# Patient Record
Sex: Female | Born: 1990 | Race: Black or African American | Hispanic: No | Marital: Single | Smoking: Former smoker
Health system: Southern US, Community
[De-identification: ages and names within clinical notes are randomized; demographics above are authoritative.]

## PROBLEM LIST (undated history)

## (undated) ENCOUNTER — Inpatient Hospital Stay: Payer: Self-pay

## (undated) DIAGNOSIS — O34219 Maternal care for unspecified type scar from previous cesarean delivery: Secondary | ICD-10-CM

## (undated) DIAGNOSIS — Z8041 Family history of malignant neoplasm of ovary: Secondary | ICD-10-CM

## (undated) DIAGNOSIS — Z803 Family history of malignant neoplasm of breast: Secondary | ICD-10-CM

## (undated) DIAGNOSIS — Z98891 History of uterine scar from previous surgery: Secondary | ICD-10-CM

## (undated) DIAGNOSIS — O1490 Unspecified pre-eclampsia, unspecified trimester: Secondary | ICD-10-CM

## (undated) HISTORY — DX: Family history of malignant neoplasm of breast: Z80.3

## (undated) HISTORY — DX: Maternal care for unspecified type scar from previous cesarean delivery: O34.219

## (undated) HISTORY — DX: Unspecified pre-eclampsia, unspecified trimester: O14.90

## (undated) HISTORY — DX: Family history of malignant neoplasm of ovary: Z80.41

## (undated) NOTE — *Deleted (*Deleted)
I value your feedback and entrusting us with your care. If you get a [facility] patient survey, I would appreciate you taking the time to let us know about your experience today. Thank you!  As of May 10, 2019, your lab results will be released to your MyChart immediately, before I even have a chance to see them. Please give me time to review them and contact you if there are any abnormalities. Thank you for your patience.  

---

## 2012-03-15 ENCOUNTER — Emergency Department: Payer: Self-pay | Admitting: Emergency Medicine

## 2012-05-19 ENCOUNTER — Emergency Department: Payer: Self-pay | Admitting: Emergency Medicine

## 2012-10-03 ENCOUNTER — Emergency Department: Payer: Self-pay | Admitting: Emergency Medicine

## 2015-01-27 ENCOUNTER — Encounter: Payer: Self-pay | Admitting: Emergency Medicine

## 2015-01-27 ENCOUNTER — Emergency Department
Admission: EM | Admit: 2015-01-27 | Discharge: 2015-01-27 | Disposition: A | Discharge status: Home / Self Care | Payer: BLUE CROSS/BLUE SHIELD | Attending: Emergency Medicine | Admitting: Emergency Medicine

## 2015-01-27 ENCOUNTER — Emergency Department: Payer: BLUE CROSS/BLUE SHIELD

## 2015-01-27 DIAGNOSIS — N939 Abnormal uterine and vaginal bleeding, unspecified: Secondary | ICD-10-CM

## 2015-01-27 DIAGNOSIS — Z3A00 Weeks of gestation of pregnancy not specified: Secondary | ICD-10-CM | POA: Insufficient documentation

## 2015-01-27 DIAGNOSIS — O209 Hemorrhage in early pregnancy, unspecified: Secondary | ICD-10-CM | POA: Diagnosis present

## 2015-01-27 DIAGNOSIS — Z87891 Personal history of nicotine dependence: Secondary | ICD-10-CM | POA: Insufficient documentation

## 2015-01-27 DIAGNOSIS — Z3490 Encounter for supervision of normal pregnancy, unspecified, unspecified trimester: Secondary | ICD-10-CM

## 2015-01-27 DIAGNOSIS — R109 Unspecified abdominal pain: Secondary | ICD-10-CM

## 2015-01-27 LAB — CHLAMYDIA/NGC RT PCR (ARMC ONLY)
Chlamydia Tr: NOT DETECTED
N gonorrhoeae: NOT DETECTED

## 2015-01-27 LAB — WET PREP, GENITAL
Clue Cells Wet Prep HPF POC: NONE SEEN
Trich, Wet Prep: NONE SEEN
WBC, Wet Prep HPF POC: NONE SEEN
Yeast Wet Prep HPF POC: NONE SEEN

## 2015-01-27 LAB — POCT PREGNANCY, URINE: Preg Test, Ur: POSITIVE — AB

## 2015-01-27 LAB — HCG, QUANTITATIVE, PREGNANCY: hCG, Beta Chain, Quant, S: 12291 m[IU]/mL — ABNORMAL HIGH (ref <5)

## 2015-01-27 NOTE — ED Notes (Signed)

## 2015-01-27 NOTE — ED Notes (Signed)
Pt states she had a positive pregnancy test last Tu, began bleeding this am, has been having menstrual like cramps off and on for a few days. Pt appears in no distress.

## 2015-01-27 NOTE — ED Provider Notes (Signed)
[facility] Emergency Department Provider Note   ____________________________________________  Time seen: 25  I have reviewed the triage vital signs and the nursing notes.   HISTORY  Chief Complaint Vaginal Bleeding   History limited by: Not Limited   HPI Cindy Lee is a 24 y.o. female who presents to the emergency department today because of concerns for lower abdominal cramping and vaginal spotting. Patient states that she recently found out she was pregnant. Last menstrual period was in July. She states that she has been having some cramping for the past couple of days. She states it was only today however that she noticed some bleeding. She states though some spotting. This is her first pregnancy. Denies any fevers. Denies any medical complaints.  History reviewed. No pertinent past medical history.  There are no active problems to display for this patient.   History reviewed. No pertinent past surgical history.  No current outpatient prescriptions on file.  Allergies Review of patient's allergies indicates no known allergies.  No family history on file.  Social History Social History  Substance Use Topics  . Smoking status: Former Games developer  . Smokeless tobacco: None  . Alcohol Use: No    Review of Systems  Constitutional: Negative for fever. Cardiovascular: Negative for chest pain. Respiratory: Negative for shortness of breath. Gastrointestinal: Positive for suprapubic and pelvic cramping. Genitourinary: Negative for dysuria. Musculoskeletal: Negative for back pain. Skin: Negative for rash. Neurological: Negative for headaches, focal weakness or numbness.  10-point ROS otherwise negative.  ____________________________________________   PHYSICAL EXAM:  VITAL SIGNS: ED Triage Vitals  Enc Vitals Group     BP 01/27/15 1541 154/83 mmHg     Pulse Rate 01/27/15 1541 115     Resp 01/27/15 1541 18     Temp 01/27/15 1541  98.6 F (37 C)     Temp Source 01/27/15 1541 Oral     SpO2 01/27/15 1541 97 %     Weight 01/27/15 1541 99 lb (44.906 kg)     Height 01/27/15 1541 5\' 4"  (1.626 m)     Head Cir --      Peak Flow --      Pain Score 01/27/15 1542 5     Pain Loc --      Pain Edu? --      Excl. in [place]? --      Constitutional: Alert and oriented. Well appearing and in no distress. Eyes: Conjunctivae are normal. PERRL. Normal extraocular movements. ENT   Head: Normocephalic and atraumatic.   Nose: No congestion/rhinnorhea.   Mouth/Throat: Mucous membranes are moist.   Neck: No stridor. Hematological/Lymphatic/Immunilogical: No cervical lymphadenopathy. Cardiovascular: Normal rate, regular rhythm.  No murmurs, rubs, or gallops. Respiratory: Normal respiratory effort without tachypnea nor retractions. Breath sounds are clear and equal bilaterally. No wheezes/rales/rhonchi. Gastrointestinal: Soft and nontender. No distention.  Genitourinary: No external lesions. No blood in vaginal vault. No abnormal discharge.  Musculoskeletal: Normal range of motion in all extremities. No joint effusions.  No lower extremity tenderness nor edema. Neurologic:  Normal speech and language. No gross focal neurologic deficits are appreciated. Speech is normal.  Skin:  Skin is warm, dry and intact. No rash noted. Psychiatric: Mood and affect are normal. Speech and behavior are normal. Patient exhibits appropriate insight and judgment.  ____________________________________________    LABS (pertinent positives/negatives)  Labs Reviewed  HCG, QUANTITATIVE, PREGNANCY - Abnormal; Notable for the following:    hCG, Beta Chain, Quant, S 12291 (*)  All other components within normal limits  POCT PREGNANCY, URINE - Abnormal; Notable for the following:    Preg Test, Ur POSITIVE (*)    All other components within normal limits  WET PREP, GENITAL  CHLAMYDIA/NGC RT PCR ([facility] ONLY)  POC URINE PREG, ED  ABO/RH      ____________________________________________   EKG  None  ____________________________________________    RADIOLOGY  Pelvic US IMPRESSION: Small embryonic pole without demonstrable cardiac activity. Findings are suspicious but not yet definitive for failed pregnancy. Recommend follow-up US in 10-14 days for definitive diagnosis. This recommendation follows SRU consensus guidelines: Diagnostic Criteria for Nonviable Pregnancy Early in the First Trimester. Malva Limes Med 2013; 369:1443-51. ____________________________________________   PROCEDURES  Procedure(s) performed: None  Critical Care performed: No  ____________________________________________   INITIAL IMPRESSION / ASSESSMENT AND PLAN / ED COURSE  Pertinent labs & imaging results that were available during my care of the patient were reviewed by me and considered in my medical decision making (see chart for details).  Patient presented to the emergency department with concerns for first trimester bleeding and cramping. Ultrasound here does raise some concern for spontaneous abortion. I did discuss the findings of the ultrasound with the patient and the necessity of repeat ultrasound. I did stress the importance of continuing prenatal vitamins. Furthermore patient was B+ and thus no Rhogam required today.  ____________________________________________   FINAL CLINICAL IMPRESSION(S) / ED DIAGNOSES  Final diagnoses:  Vaginal bleeding  Abdominal cramping  Pregnant     Phineas Semen, MD 01/27/15 2236

## 2015-01-27 NOTE — Discharge Instructions (Signed)
Please take prenatal vitamins everyday. Please seek medical attention for any high fevers, chest pain, shortness of breath, change in behavior, persistent vomiting, bloody stool or any other new or concerning symptoms.  Threatened Miscarriage A threatened miscarriage occurs when you have vaginal bleeding during your first 20 weeks of pregnancy but the pregnancy has not ended. If you have vaginal bleeding during this time, your health care provider will do tests to make sure you are still pregnant. If the tests show you are still pregnant and the developing baby (fetus) inside your womb (uterus) is still growing, your condition is considered a threatened miscarriage. A threatened miscarriage does not mean your pregnancy will end, but it does increase the risk of losing your pregnancy (complete miscarriage). CAUSES  The cause of a threatened miscarriage is usually not known. If you go on to have a complete miscarriage, the most common cause is an abnormal number of chromosomes in the developing baby. Chromosomes are the structures inside cells that hold all your genetic material. Some causes of vaginal bleeding that do not result in miscarriage include:  Having sex.  Having an infection.  Normal hormone changes of pregnancy.  Bleeding that occurs when an egg implants in your uterus. RISK FACTORS Risk factors for bleeding in early pregnancy include:  Obesity.  Smoking.  Drinking excessive amounts of alcohol or caffeine.  Recreational drug use. SIGNS AND SYMPTOMS  Light vaginal bleeding.  Mild abdominal pain or cramps. DIAGNOSIS  If you have bleeding with or without abdominal pain before 20 weeks of pregnancy, your health care provider will do tests to check whether you are still pregnant. One important test involves using sound waves and a computer (ultrasound) to create images of the inside of your uterus. Other tests include an internal exam of your vagina and uterus (pelvic exam) and  measurement of your baby's heart rate.  You may be diagnosed with a threatened miscarriage if:  Ultrasound testing shows you are still pregnant.  Your baby's heart rate is strong.  A pelvic exam shows that the opening between your uterus and your vagina (cervix) is closed.  Your heart rate and blood pressure are stable.  Blood tests confirm you are still pregnant. TREATMENT  No treatments have been shown to prevent a threatened miscarriage from going on to a complete miscarriage. However, the right home care is important.  HOME CARE INSTRUCTIONS   Make sure you keep all your appointments for prenatal care. This is very important.  Get plenty of rest.  Do not have sex or use tampons if you have vaginal bleeding.  Do not douche.  Do not smoke or use recreational drugs.  Do not drink alcohol.  Avoid caffeine. SEEK MEDICAL CARE IF:  You have light vaginal bleeding or spotting while pregnant.  You have abdominal pain or cramping.  You have a fever. SEEK IMMEDIATE MEDICAL CARE IF:  You have heavy vaginal bleeding.  You have blood clots coming from your vagina.  You have severe low back pain or abdominal cramps.  You have fever, chills, and severe abdominal pain. MAKE SURE YOU:  Understand these instructions.  Will watch your condition.  Will get help right away if you are not doing well or get worse. Document Released: 05/17/2005 Document Revised: 05/22/2013 Document Reviewed: 03/13/2013 [facility] Patient Information 2015 [place], [place]. This information is not intended to replace advice given to you by your health care provider. Make sure you discuss any questions you have with your health care provider.

## 2015-01-27 NOTE — ED Notes (Signed)
Pt states abd cramping and vaginal bleeding this AM, states she found out she was pregnant last week and has been nervous, pt has no other children, pt in no distress stating she is not bleeding at this time

## 2015-01-28 LAB — ABO/RH: ABO/RH(D): B POS

## 2015-05-12 ENCOUNTER — Encounter: Payer: Self-pay | Admitting: Urgent Care

## 2015-05-12 DIAGNOSIS — K0889 Other specified disorders of teeth and supporting structures: Secondary | ICD-10-CM | POA: Insufficient documentation

## 2015-05-12 DIAGNOSIS — Z3A21 21 weeks gestation of pregnancy: Secondary | ICD-10-CM | POA: Diagnosis not present

## 2015-05-12 DIAGNOSIS — O9989 Other specified diseases and conditions complicating pregnancy, childbirth and the puerperium: Secondary | ICD-10-CM | POA: Insufficient documentation

## 2015-05-12 DIAGNOSIS — O99612 Diseases of the digestive system complicating pregnancy, second trimester: Secondary | ICD-10-CM | POA: Diagnosis present

## 2015-05-12 DIAGNOSIS — R51 Headache: Secondary | ICD-10-CM | POA: Diagnosis not present

## 2015-05-12 DIAGNOSIS — Z87891 Personal history of nicotine dependence: Secondary | ICD-10-CM | POA: Diagnosis not present

## 2015-05-12 NOTE — ED Notes (Signed)
Patient presents with a 2 week history of RIGHT upper jaw pain secondary to a bad tooth. Patient advising that she had a filling that "came out". (+) swelling. Denies fever. Of note, patient is currently 5 months pregnant.

## 2015-05-13 ENCOUNTER — Emergency Department
Admission: EM | Admit: 2015-05-13 | Discharge: 2015-05-13 | Disposition: A | Discharge status: Home / Self Care | Payer: BLUE CROSS/BLUE SHIELD | Attending: Emergency Medicine | Admitting: Emergency Medicine

## 2015-05-13 DIAGNOSIS — K0889 Other specified disorders of teeth and supporting structures: Secondary | ICD-10-CM

## 2015-05-13 MED ORDER — LIDOCAINE-EPINEPHRINE 2 %-1:100000 IJ SOLN
1.7000 mL | Freq: Once | INTRAMUSCULAR | Status: AC
  Administered 2015-05-13: 04:00:00

## 2015-05-13 MED ORDER — BUPIVACAINE HCL (PF) 0.25 % IJ SOLN
INTRAMUSCULAR | Status: AC
  Filled 2015-05-13: qty 30

## 2015-05-13 MED ORDER — PENICILLIN V POTASSIUM 500 MG PO TABS: 500.0000 mg | ORAL_TABLET | Freq: Three times a day (TID) | ORAL | Status: DC

## 2015-05-13 MED ORDER — PENICILLIN V POTASSIUM 250 MG PO TABS
500.0000 mg | ORAL_TABLET | Freq: Once | ORAL | Status: AC
  Administered 2015-05-13: 500 mg via ORAL
  Filled 2015-05-13: qty 2

## 2015-05-13 MED ORDER — LIDOCAINE-EPINEPHRINE 2 %-1:100000 IJ SOLN
INTRAMUSCULAR | Status: AC
  Filled 2015-05-13: qty 1.7

## 2015-05-13 MED ORDER — ACETAMINOPHEN 500 MG PO TABS
1000.0000 mg | ORAL_TABLET | Freq: Once | ORAL | Status: AC
  Administered 2015-05-13: 1000 mg via ORAL
  Filled 2015-05-13: qty 2

## 2015-05-13 NOTE — ED Provider Notes (Signed)
[facility] Emergency Department Provider Note  ____________________________________________  Time seen: Approximately 225 AM  I have reviewed the triage vital signs and the nursing notes.   HISTORY  Chief Complaint Dental Pain    HPI Cindy Lee is a 24 y.o. female who comes into the hospital today with some dental pain. The patient reports that a week ago a filling came out of one of her upper teeth in the back. She reports that it started to get loose 2 weeks ago in Foley came out one week ago. The patient started having some pain in her tooth at 6 PM and reports this is gotten worse and worse. The patient is [redacted] weeks pregnant and only took Tylenol for pain because of her pregnancy. She reports that he was helping until later in the afternoon when it did not help at all. The patient does not have a dentist and did not try to contact a dentist. She reports that she is unable to eat and sleep. The patient has also had a migraine for the last 2 days. The patient rates her pain a 7 out of 10 in intensity. The patient has had no facial swelling and no fevers. The patient reports that she just wants something to be done to help her with the pain.   History reviewed. No pertinent past medical history.  There are no active problems to display for this patient.   History reviewed. No pertinent past surgical history.  No current outpatient prescriptions on file.  Allergies Review of patient's allergies indicates no known allergies.  No family history on file.  Social History Social History  Substance Use Topics  . Smoking status: Former Research scientist (life sciences)  . Smokeless tobacco: None  . Alcohol Use: No    Review of Systems Constitutional: No fever/chills Eyes: No visual changes. ENT: Dental pain Cardiovascular: Denies chest pain. Respiratory: Denies shortness of breath. Gastrointestinal: No abdominal pain.  No nausea, no vomiting.  No diarrhea.  No  constipation. Genitourinary: Negative for dysuria. Musculoskeletal: Negative for back pain. Skin: Negative for rash. Neurological: Headache  10-point ROS otherwise negative.  ____________________________________________   PHYSICAL EXAM:  VITAL SIGNS: ED Triage Vitals  Enc Vitals Group     BP 05/12/15 2306 132/85 mmHg     Pulse Rate 05/12/15 2306 87     Resp 05/12/15 2306 16     Temp 05/12/15 2306 97.8 F (36.6 C)     Temp Source 05/12/15 2306 Oral     SpO2 05/12/15 2306 98 %     Weight 05/12/15 2306 116 lb (52.617 kg)     Height 05/12/15 2306 _0  (1.651 m)     Head Cir --      Peak Flow --      Pain Score 05/12/15 2307 8     Pain Loc --      Pain Edu? --      Excl. in [place]? --     Constitutional: Alert and oriented. Well appearing and in moderate distress. Eyes: Conjunctivae are normal. PERRL. EOMI. Head: Atraumatic. Nose: No congestion/rhinnorhea. Mouth/Throat: Mucous membranes are moist.  Oropharynx non-erythematous. Cavity noted in right second molar with tenderness to palpation no swelling noticed Cardiovascular: Normal rate, regular rhythm. Grossly normal heart sounds.  Good peripheral circulation. Respiratory: Normal respiratory effort.  No retractions. Lungs CTAB. Gastrointestinal: Soft and nontender. No distention. Positive bowel sounds Musculoskeletal: No lower extremity tenderness nor edema.   Neurologic:  Normal speech and language.  Skin:  Skin is warm, dry and intact.  Psychiatric: Mood and affect are normal.   ____________________________________________   LABS (all labs ordered are listed, but only abnormal results are displayed)  Labs Reviewed - No data to display ____________________________________________  EKG  None ____________________________________________  RADIOLOGY  None ____________________________________________   PROCEDURES  Procedure(s) performed: Please, see procedure note(s).  The procedure performed was a dental  block of the right second molar. Dental lidocaine was used with the dental kit which was lidocaine, epinephrine injection. The block was done with a 30-gauge needle. Approximately 1-1/2-2 cc of lidocaine was instilled in the area above the second molar. The patient will be discharged to home to follow-up with the dentist.  Critical Care performed: No  ____________________________________________   INITIAL IMPRESSION / ASSESSMENT AND PLAN / ED COURSE  Pertinent labs & imaging results that were available during my care of the patient were reviewed by me and considered in my medical decision making (see chart for details).  This is a 24 year old female who comes in with dental pain. I gave the patient a dose of penicillin as well as Tylenol. I then did a dental block on the patient to help with her pain. The patient will be discharged home and she is strongly encouraged to follow-up with a dentist for further treatment of her dental pain and cavity. ____________________________________________   FINAL CLINICAL IMPRESSION(S) / ED DIAGNOSES  Final diagnoses:  Pain, dental      Loney Hering, MD 05/13/15 [phone]

## 2015-07-03 LAB — OB RESULTS CONSOLE RPR: RPR: NONREACTIVE

## 2015-07-03 LAB — OB RESULTS CONSOLE HIV ANTIBODY (ROUTINE TESTING): HIV: NONREACTIVE

## 2015-09-11 ENCOUNTER — Inpatient Hospital Stay
Admission: EM | Admit: 2015-09-11 | Discharge: 2015-09-15 | DRG: 766 | Disposition: A | Discharge status: Home / Self Care | Payer: BLUE CROSS/BLUE SHIELD | Attending: Obstetrics & Gynecology | Admitting: Obstetrics & Gynecology

## 2015-09-11 ENCOUNTER — Encounter: Payer: Self-pay | Admitting: *Deleted

## 2015-09-11 DIAGNOSIS — Z3A38 38 weeks gestation of pregnancy: Secondary | ICD-10-CM | POA: Diagnosis not present

## 2015-09-11 DIAGNOSIS — O99824 Streptococcus B carrier state complicating childbirth: Secondary | ICD-10-CM | POA: Diagnosis present

## 2015-09-11 DIAGNOSIS — Z87891 Personal history of nicotine dependence: Secondary | ICD-10-CM

## 2015-09-11 DIAGNOSIS — O4440 Low lying placenta NOS or without hemorrhage, unspecified trimester: Secondary | ICD-10-CM | POA: Diagnosis present

## 2015-09-11 DIAGNOSIS — O0933 Supervision of pregnancy with insufficient antenatal care, third trimester: Secondary | ICD-10-CM

## 2015-09-11 DIAGNOSIS — O9902 Anemia complicating childbirth: Secondary | ICD-10-CM | POA: Diagnosis present

## 2015-09-11 DIAGNOSIS — O1414 Severe pre-eclampsia complicating childbirth: Secondary | ICD-10-CM | POA: Diagnosis present

## 2015-09-11 DIAGNOSIS — D649 Anemia, unspecified: Secondary | ICD-10-CM | POA: Diagnosis present

## 2015-09-11 DIAGNOSIS — O1413 Severe pre-eclampsia, third trimester: Secondary | ICD-10-CM | POA: Diagnosis present

## 2015-09-11 LAB — COMPREHENSIVE METABOLIC PANEL
ALT: 19 U/L (ref 14–54)
AST: 26 U/L (ref 15–41)
Albumin: 3 g/dL — ABNORMAL LOW (ref 3.5–5.0)
Alkaline Phosphatase: 285 U/L — ABNORMAL HIGH (ref 38–126)
Anion gap: 9 (ref 5–15)
BUN: 8 mg/dL (ref 6–20)
CO2: 17 mmol/L — ABNORMAL LOW (ref 22–32)
Calcium: 9.2 mg/dL (ref 8.9–10.3)
Chloride: 107 mmol/L (ref 101–111)
Creatinine, Ser: 0.6 mg/dL (ref 0.44–1.00)
GFR calc Af Amer: >60 mL/min (ref >60)
GFR calc non Af Amer: >60 mL/min (ref >60)
Glucose, Bld: 100 mg/dL — ABNORMAL HIGH (ref 65–99)
Potassium: 3.3 mmol/L — ABNORMAL LOW (ref 3.5–5.1)
Sodium: 133 mmol/L — ABNORMAL LOW (ref 135–145)
Total Bilirubin: 0.6 mg/dL (ref 0.3–1.2)
Total Protein: 6.7 g/dL (ref 6.5–8.1)

## 2015-09-11 LAB — CBC
HCT: 31.5 % — ABNORMAL LOW (ref 35.0–47.0)
Hemoglobin: 10.5 g/dL — ABNORMAL LOW (ref 12.0–16.0)
MCH: 28.2 pg (ref 26.0–34.0)
MCHC: 33.2 g/dL (ref 32.0–36.0)
MCV: 84.7 fL (ref 80.0–100.0)
Platelets: 246 10*3/uL (ref 150–440)
RBC: 3.72 MIL/uL — ABNORMAL LOW (ref 3.80–5.20)
RDW: 15.2 % — ABNORMAL HIGH (ref 11.5–14.5)
WBC: 7.1 10*3/uL (ref 3.6–11.0)

## 2015-09-11 LAB — TYPE AND SCREEN
ABO/RH(D): B POS
Antibody Screen: NEGATIVE

## 2015-09-11 LAB — PROTEIN / CREATININE RATIO, URINE
Creatinine, Urine: 192 mg/dL
Protein Creatinine Ratio: 0.74 mg/mg{Cre} — ABNORMAL HIGH (ref 0.00–0.15)
Total Protein, Urine: 143 mg/dL

## 2015-09-11 MED ORDER — LABETALOL HCL 200 MG PO TABS
200.0000 mg | ORAL_TABLET | Freq: Two times a day (BID) | ORAL | Status: DC
  Administered 2015-09-11 – 2015-09-12 (×2): 200 mg via ORAL
  Filled 2015-09-11: qty 1

## 2015-09-11 MED ORDER — AMMONIA AROMATIC IN INHA
RESPIRATORY_TRACT | Status: DC
Start: 2015-09-11 — End: 2015-09-12
  Filled 2015-09-11: qty 10

## 2015-09-11 MED ORDER — MAGNESIUM SULFATE BOLUS VIA INFUSION
4.0000 g | Freq: Once | INTRAVENOUS | Status: AC
  Administered 2015-09-11: 4 g via INTRAVENOUS
  Filled 2015-09-11: qty 500

## 2015-09-11 MED ORDER — ACETAMINOPHEN 500 MG PO TABS
1000.0000 mg | ORAL_TABLET | ORAL | Status: DC | PRN
Start: 2015-09-11 — End: 2015-09-12

## 2015-09-11 MED ORDER — MISOPROSTOL 200 MCG PO TABS
ORAL_TABLET | ORAL | Status: AC
  Filled 2015-09-11: qty 4

## 2015-09-11 MED ORDER — LIDOCAINE HCL (PF) 1 % IJ SOLN
INTRAMUSCULAR | Status: AC
  Filled 2015-09-11: qty 30

## 2015-09-11 MED ORDER — LABETALOL HCL 5 MG/ML IV SOLN
INTRAVENOUS | Status: AC
  Administered 2015-09-11: 20 mg via INTRAVENOUS
  Filled 2015-09-11: qty 4

## 2015-09-11 MED ORDER — TERBUTALINE SULFATE 1 MG/ML IJ SOLN
0.2500 mg | Freq: Once | INTRAMUSCULAR | Status: AC | PRN
  Administered 2015-09-12: 0.25 mg via SUBCUTANEOUS

## 2015-09-11 MED ORDER — LIDOCAINE HCL (PF) 1 % IJ SOLN: 30.0000 mL | INTRAMUSCULAR | Status: DC | PRN

## 2015-09-11 MED ORDER — HYDRALAZINE HCL 20 MG/ML IJ SOLN: 10.0000 mg | Freq: Once | INTRAMUSCULAR | Status: DC | PRN

## 2015-09-11 MED ORDER — OXYTOCIN BOLUS FROM INFUSION: 500.0000 mL | INTRAVENOUS | Status: DC

## 2015-09-11 MED ORDER — LACTATED RINGERS IV SOLN
INTRAVENOUS | Status: DC
  Administered 2015-09-11 – 2015-09-12 (×3): via INTRAVENOUS
  Administered 2015-09-12: 100 mL/h via INTRAVENOUS

## 2015-09-11 MED ORDER — BUTORPHANOL TARTRATE 1 MG/ML IJ SOLN: 1.0000 mg | INTRAMUSCULAR | Status: DC | PRN

## 2015-09-11 MED ORDER — LABETALOL HCL 200 MG PO TABS
ORAL_TABLET | ORAL | Status: AC
  Administered 2015-09-11: 200 mg via ORAL
  Filled 2015-09-11: qty 1

## 2015-09-11 MED ORDER — LABETALOL HCL 5 MG/ML IV SOLN
20.0000 mg | INTRAVENOUS | Status: DC | PRN
  Administered 2015-09-11 (×2): 20 mg via INTRAVENOUS

## 2015-09-11 MED ORDER — LACTATED RINGERS IV SOLN
500.0000 mL | INTRAVENOUS | Status: DC | PRN
  Administered 2015-09-12: 200 mL via INTRAVENOUS

## 2015-09-11 MED ORDER — MAGNESIUM SULFATE 50 % IJ SOLN
2.0000 g/h | INTRAVENOUS | Status: DC
  Administered 2015-09-12 – 2015-09-13 (×2): 2 g/h via INTRAVENOUS
  Filled 2015-09-11 (×3): qty 80

## 2015-09-11 MED ORDER — CALCIUM GLUCONATE 10 % IV SOLN
INTRAVENOUS | Status: AC
  Filled 2015-09-11: qty 10

## 2015-09-11 MED ORDER — DINOPROSTONE 10 MG VA INST
10.0000 mg | VAGINAL_INSERT | Freq: Once | VAGINAL | Status: AC
  Administered 2015-09-11: 10 mg via VAGINAL
  Filled 2015-09-11: qty 1

## 2015-09-11 MED ORDER — SODIUM CHLORIDE FLUSH 0.9 % IV SOLN
INTRAVENOUS | Status: AC
  Administered 2015-09-11: 10 mL
  Filled 2015-09-11: qty 10

## 2015-09-11 MED ORDER — ONDANSETRON HCL 4 MG/2ML IJ SOLN
4.0000 mg | Freq: Four times a day (QID) | INTRAMUSCULAR | Status: DC | PRN
  Administered 2015-09-12: 8 mg via INTRAVENOUS

## 2015-09-11 MED ORDER — OXYTOCIN 40 UNITS IN LACTATED RINGERS INFUSION - SIMPLE MED: 1.0000 m[IU]/min | INTRAVENOUS | Status: DC

## 2015-09-11 MED ORDER — OXYTOCIN 40 UNITS IN LACTATED RINGERS INFUSION - SIMPLE MED
2.5000 [IU]/h | INTRAVENOUS | Status: DC
  Filled 2015-09-11: qty 1000

## 2015-09-11 MED ORDER — CITRIC ACID-SODIUM CITRATE 334-500 MG/5ML PO SOLN
30.0000 mL | ORAL | Status: DC | PRN
  Administered 2015-09-12: 30 mL via ORAL

## 2015-09-11 MED ORDER — HYDRALAZINE HCL 20 MG/ML IJ SOLN
INTRAMUSCULAR | Status: AC
  Filled 2015-09-11: qty 1

## 2015-09-11 MED ORDER — OXYTOCIN 10 UNIT/ML IJ SOLN
INTRAMUSCULAR | Status: DC
Start: 2015-09-11 — End: 2015-09-12
  Filled 2015-09-11: qty 2

## 2015-09-11 NOTE — H&P (Signed)
OB History & Physical   History of Present Illness:  Chief Complaint:   HPI:  Cindy Lee is a 25 y.o. G1P0 female at 38.0 by 16week ultrasound with EDC of 09/25/15 was sent from the office today with BP of 144/86 and 3day frontal headache.     +FM, no CTX, no LOF, no VB, + HA, no change of vision, no RUQ/epigastric pain, no SOB  Pregnancy Issues: 1. Elevated blood pressure 2. Late to care 3. Anemia 4. Resolved low-lying placenta  Maternal Medical History:  History reviewed. No pertinent past medical history.  History reviewed. No pertinent past surgical history.  No Known Allergies  Prior to Admission medications   Medication Sig Start Date End Date Taking? Authorizing Provider  penicillin v potassium (VEETID) 500 MG tablet Take 1 tablet (500 mg total) by mouth 3 (three) times daily. 05/13/15   Rebecka ApleyAllison P Webster, MD     [facility]   Social History: She  reports that she has quit smoking. She does not have any smokeless tobacco history on file. She reports that she does not drink alcohol.  Family History: family history is not on file.   Review of Systems: Negative x 10 systems reviewed except as noted in the HPI.     Physical Exam:  Vital Signs: BP 153/97 mmHg  Pulse 96  Temp(Src) 98.6 F (37 C) (Oral)  Resp 16  LMP 12/19/2014 General: no acute distress.  HEENT: normocephalic, atraumatic Heart: regular rate & rhythm.  No murmurs/rubs/gallops Lungs: clear to auscultation bilaterally, normal respiratory effort Abdomen: soft, gravid, non-tender;  EFW: 6.15 Pelvic:   External: Normal external female genitalia  Cervix: Dilation: Closed / Effacement (%): Thick / Station: -3    Extremities: non-tender, symmetric, 0 edema bilaterally.  DTRs: 2+ Neurologic: Alert & oriented x 3.    Results for orders placed or performed during the hospital encounter of 09/11/15 (from the past 24 hour(s))  Protein / creatinine ratio, urine     Status:  Abnormal   Collection Time: 09/11/15  4:18 PM  Result Value Ref Range   Creatinine, Urine 192 mg/dL   Total Protein, Urine 143 mg/dL   Protein Creatinine Ratio 0.74 (H) 0.00 - 0.15 mg/mg[Cre]  CBC     Status: Abnormal   Collection Time: 09/11/15  4:22 PM  Result Value Ref Range   WBC 7.1 3.6 - 11.0 K/uL   RBC 3.72 (L) 3.80 - 5.20 MIL/uL   Hemoglobin 10.5 (L) 12.0 - 16.0 g/dL   HCT 16.131.5 (L) 09.635.0 - 04.547.0 %   MCV 84.7 80.0 - 100.0 fL   MCH 28.2 26.0 - 34.0 pg   MCHC 33.2 32.0 - 36.0 g/dL   RDW 40.915.2 (H) 81.111.5 - 91.414.5 %   Platelets 246 150 - 440 K/uL  Type and screen [facility]     Status: None   Collection Time: 09/11/15  4:22 PM  Result Value Ref Range   ABO/RH(D) B POS    Antibody Screen NEG    Sample Expiration 09/14/2015   Comprehensive metabolic panel     Status: Abnormal   Collection Time: 09/11/15  4:22 PM  Result Value Ref Range   Sodium 133 (L) 135 - 145 mmol/L   Potassium 3.3 (L) 3.5 - 5.1 mmol/L   Chloride 107 101 - 111 mmol/L   CO2 17 (L) 22 - 32 mmol/L   Glucose, Bld 100 (H) 65 - 99 mg/dL   BUN 8 6 -  20 mg/dL   Creatinine, Ser 4.09 0.44 - 1.00 mg/dL   Calcium 9.2 8.9 - 81.1 mg/dL   Total Protein 6.7 6.5 - 8.1 g/dL   Albumin 3.0 (L) 3.5 - 5.0 g/dL   AST 26 15 - 41 U/L   ALT 19 14 - 54 U/L   Alkaline Phosphatase 285 (H) 38 - 126 U/L   Total Bilirubin 0.6 0.3 - 1.2 mg/dL   GFR calc non Af Amer >60 >60 mL/min   GFR calc Af Amer >60 >60 mL/min   Anion gap 9 5 - 15    Pertinent Results:  Prenatal Labs: Blood type/Rh B pos  Antibody screen neg  Rubella Immune  Varicella Immune  RPR NR  HBsAg Neg  HIV NR  GC neg  Chlamydia neg  Genetic screening negative  1 hour GTT 134  3 hour GTT   GBS Positive 08/29/15   FHT:  135 mod + accels no decels TOCO: q64min SVE: closed/thick/high/posterior/firm - very difficult exam; patient does not tolerate well.   Cephalic by leopolds  Assessment:  Cindy Lee is a 25 y.o. G1P0 female at 38.0  with preeclampsia with severe features.   Plan:  1. Admit to Labor & Delivery 2. CBC, T&S, Clrs, IVF 3. GBS  Positive - penicillin ordered, to start in active labor 4. Consents obtained. 5. Continuous efm/toco 6. Preeclampsia:  Severe features with HA, some occasional 160 SBP but resolving with recheck.  Start Magnesium Sulfate bolus and gtt @ 2g/hr.  Foley, continuous pulse ox, monitor for mag toxicity 7. Active management: cytotec q4h as tolerated, will consider foley bulb and/or pitocin when cervix is more accessible.    ----- Ranae Plumber, MD Attending Obstetrician and Gynecologist [facility]

## 2015-09-11 NOTE — Progress Notes (Signed)
RN at the bedside assisting patient to comfortable position, EFM adjusted.  Pt. Just completed a regular diet without emesis (regular diet per patient request - OK'd via MD).  Clear liquids after midnight. Pt. In agreement. IV site without s/s of infiltrate, infusing 100 ml LR and 25 ml of Mag. Sulfate (2g/hr).  UOP 75 hour.  Pt. Currently without concern for epigastric pain, or blurred vision. Pt. Complained of slight headache earlier, but declined tylenol.  Reflex +2, no clonus, +1 pedal edema bilateral. FHR with baseline 120's, mod. variability, with accels and no decels; contracting irreg. 2-7 minutes, 40-80 secs. Per patient, "not feeling contractions". RN will continue to monitor.

## 2015-09-12 ENCOUNTER — Inpatient Hospital Stay: Payer: BLUE CROSS/BLUE SHIELD | Admitting: Anesthesiology

## 2015-09-12 ENCOUNTER — Encounter
Admission: EM | Disposition: A | Discharge status: Home / Self Care | Payer: Self-pay | Source: Home / Self Care | Attending: Obstetrics & Gynecology

## 2015-09-12 ENCOUNTER — Encounter: Payer: Self-pay | Admitting: *Deleted

## 2015-09-12 LAB — RPR: RPR Ser Ql: NONREACTIVE

## 2015-09-12 LAB — PLATELET COUNT: Platelets: 249 10*3/uL (ref 150–440)

## 2015-09-12 SURGERY — Surgical Case
Anesthesia: Spinal

## 2015-09-12 MED ORDER — PRENATAL MULTIVITAMIN CH
1.0000 | ORAL_TABLET | Freq: Every day | ORAL | Status: DC
  Administered 2015-09-13 – 2015-09-15 (×3): 1 via ORAL
  Filled 2015-09-12 (×4): qty 1

## 2015-09-12 MED ORDER — OXYTOCIN 40 UNITS IN LACTATED RINGERS INFUSION - SIMPLE MED: 1.0000 m[IU]/min | INTRAVENOUS | Status: DC

## 2015-09-12 MED ORDER — CEFAZOLIN SODIUM-DEXTROSE 2-4 GM/100ML-% IV SOLN
INTRAVENOUS | Status: AC
  Administered 2015-09-12: 2 g via INTRAVENOUS
  Filled 2015-09-12: qty 100

## 2015-09-12 MED ORDER — CEFAZOLIN SODIUM-DEXTROSE 2-3 GM-% IV SOLR
INTRAVENOUS | Status: DC | PRN
  Administered 2015-09-12: 2 g via INTRAVENOUS

## 2015-09-12 MED ORDER — SIMETHICONE 80 MG PO CHEW
80.0000 mg | CHEWABLE_TABLET | Freq: Three times a day (TID) | ORAL | Status: DC
  Administered 2015-09-13 – 2015-09-15 (×7): 80 mg via ORAL
  Filled 2015-09-12 (×6): qty 1

## 2015-09-12 MED ORDER — PENICILLIN G POTASSIUM 5000000 UNITS IJ SOLR
5.0000 10*6.[IU] | Freq: Once | INTRAMUSCULAR | Status: DC
  Filled 2015-09-12: qty 5

## 2015-09-12 MED ORDER — NALOXONE HCL 2 MG/2ML IJ SOSY
1.0000 ug/kg/h | PREFILLED_SYRINGE | INTRAVENOUS | Status: DC | PRN
  Filled 2015-09-12: qty 2

## 2015-09-12 MED ORDER — BUPIVACAINE ON-Q PAIN PUMP (FOR ORDER SET NO CHG): INJECTION | Status: DC

## 2015-09-12 MED ORDER — DIBUCAINE 1 % RE OINT: 1.0000 "application " | TOPICAL_OINTMENT | RECTAL | Status: DC | PRN

## 2015-09-12 MED ORDER — TERBUTALINE SULFATE 1 MG/ML IJ SOLN
0.2500 mg | INTRAMUSCULAR | Status: AC
  Administered 2015-09-12: 0.25 mg via SUBCUTANEOUS

## 2015-09-12 MED ORDER — NALBUPHINE HCL 10 MG/ML IJ SOLN: 5.0000 mg | Freq: Once | INTRAMUSCULAR | Status: DC | PRN

## 2015-09-12 MED ORDER — OXYTOCIN 40 UNITS IN LACTATED RINGERS INFUSION - SIMPLE MED
INTRAVENOUS | Status: AC
  Administered 2015-09-12: 600 mL via INTRAVENOUS
  Filled 2015-09-12: qty 1000

## 2015-09-12 MED ORDER — OXYTOCIN 40 UNITS IN LACTATED RINGERS INFUSION - SIMPLE MED: 2.5000 [IU]/h | INTRAVENOUS | Status: AC

## 2015-09-12 MED ORDER — OXYCODONE-ACETAMINOPHEN 5-325 MG PO TABS
1.0000 | ORAL_TABLET | ORAL | Status: DC | PRN
  Administered 2015-09-13 – 2015-09-15 (×7): 1 via ORAL
  Filled 2015-09-12 (×7): qty 1

## 2015-09-12 MED ORDER — CITRIC ACID-SODIUM CITRATE 334-500 MG/5ML PO SOLN
ORAL | Status: AC
  Filled 2015-09-12: qty 15

## 2015-09-12 MED ORDER — MISOPROSTOL 25 MCG QUARTER TABLET
25.0000 ug | ORAL_TABLET | ORAL | Status: DC | PRN
  Administered 2015-09-12: 25 ug via VAGINAL

## 2015-09-12 MED ORDER — BUPIVACAINE IN DEXTROSE 0.75-8.25 % IT SOLN
INTRATHECAL | Status: DC | PRN
  Administered 2015-09-12: 1.6 mL via INTRATHECAL

## 2015-09-12 MED ORDER — KETOROLAC TROMETHAMINE 30 MG/ML IJ SOLN: 30.0000 mg | Freq: Four times a day (QID) | INTRAMUSCULAR | Status: AC | PRN

## 2015-09-12 MED ORDER — WITCH HAZEL-GLYCERIN EX PADS: 1.0000 "application " | MEDICATED_PAD | CUTANEOUS | Status: DC | PRN

## 2015-09-12 MED ORDER — OXYCODONE-ACETAMINOPHEN 5-325 MG PO TABS
2.0000 | ORAL_TABLET | ORAL | Status: DC | PRN
  Filled 2015-09-12: qty 2

## 2015-09-12 MED ORDER — MISOPROSTOL 25 MCG QUARTER TABLET
ORAL_TABLET | ORAL | Status: AC
  Administered 2015-09-12: 25 ug via VAGINAL
  Filled 2015-09-12: qty 0.25

## 2015-09-12 MED ORDER — TERBUTALINE SULFATE 1 MG/ML IJ SOLN
INTRAMUSCULAR | Status: AC
  Administered 2015-09-12: 0.25 mg via SUBCUTANEOUS
  Filled 2015-09-12: qty 1

## 2015-09-12 MED ORDER — MORPHINE SULFATE (PF) 0.5 MG/ML IJ SOLN
INTRAMUSCULAR | Status: DC | PRN
  Administered 2015-09-12: .1 mg via INTRATHECAL

## 2015-09-12 MED ORDER — KETOROLAC TROMETHAMINE 30 MG/ML IJ SOLN
30.0000 mg | Freq: Four times a day (QID) | INTRAMUSCULAR | Status: AC | PRN
  Administered 2015-09-12 – 2015-09-13 (×4): 30 mg via INTRAVENOUS
  Filled 2015-09-12 (×4): qty 1

## 2015-09-12 MED ORDER — TERBUTALINE SULFATE 1 MG/ML IJ SOLN: 0.2500 mg | Freq: Once | INTRAMUSCULAR | Status: DC | PRN

## 2015-09-12 MED ORDER — BUPIVACAINE HCL 0.5 % IJ SOLN
INTRAMUSCULAR | Status: DC | PRN
  Administered 2015-09-12: 13 mL

## 2015-09-12 MED ORDER — DIPHENHYDRAMINE HCL 25 MG PO CAPS: 25.0000 mg | ORAL_CAPSULE | Freq: Four times a day (QID) | ORAL | Status: DC | PRN

## 2015-09-12 MED ORDER — NALOXONE HCL 0.4 MG/ML IJ SOLN: 0.4000 mg | INTRAMUSCULAR | Status: DC | PRN

## 2015-09-12 MED ORDER — BUPIVACAINE HCL (PF) 0.5 % IJ SOLN
INTRAMUSCULAR | Status: AC
Start: 2015-09-12 — End: 2015-09-13
  Filled 2015-09-12: qty 30

## 2015-09-12 MED ORDER — SODIUM CHLORIDE FLUSH 0.9 % IV SOLN
INTRAVENOUS | Status: AC
  Filled 2015-09-12: qty 20

## 2015-09-12 MED ORDER — ONDANSETRON HCL 4 MG/2ML IJ SOLN: 4.0000 mg | Freq: Three times a day (TID) | INTRAMUSCULAR | Status: DC | PRN

## 2015-09-12 MED ORDER — PENICILLIN G POTASSIUM 5000000 UNITS IJ SOLR
2.5000 10*6.[IU] | INTRAVENOUS | Status: DC
  Filled 2015-09-12 (×4): qty 2.5

## 2015-09-12 MED ORDER — FENTANYL CITRATE (PF) 100 MCG/2ML IJ SOLN
INTRAMUSCULAR | Status: DC | PRN
  Administered 2015-09-12: 20 ug via INTRATHECAL

## 2015-09-12 MED ORDER — SENNOSIDES-DOCUSATE SODIUM 8.6-50 MG PO TABS
2.0000 | ORAL_TABLET | ORAL | Status: DC
  Administered 2015-09-14 – 2015-09-15 (×2): 2 via ORAL
  Filled 2015-09-12 (×2): qty 2

## 2015-09-12 MED ORDER — DIPHENHYDRAMINE HCL 25 MG PO CAPS: 25.0000 mg | ORAL_CAPSULE | ORAL | Status: DC | PRN

## 2015-09-12 MED ORDER — BUPIVACAINE 0.25 % ON-Q PUMP DUAL CATH 400 ML
INJECTION | Status: AC
  Filled 2015-09-12: qty 400

## 2015-09-12 MED ORDER — CITRIC ACID-SODIUM CITRATE 334-500 MG/5ML PO SOLN
ORAL | Status: AC
  Administered 2015-09-12: 30 mL via ORAL
  Filled 2015-09-12: qty 15

## 2015-09-12 MED ORDER — MENTHOL 3 MG MT LOZG
1.0000 | LOZENGE | OROMUCOSAL | Status: DC | PRN
  Filled 2015-09-12: qty 9

## 2015-09-12 MED ORDER — NALBUPHINE HCL 10 MG/ML IJ SOLN: 5.0000 mg | INTRAMUSCULAR | Status: DC | PRN

## 2015-09-12 MED ORDER — FAMOTIDINE 20 MG PO TABS
20.0000 mg | ORAL_TABLET | Freq: Two times a day (BID) | ORAL | Status: DC
  Administered 2015-09-12: 20 mg via ORAL
  Filled 2015-09-12: qty 1

## 2015-09-12 MED ORDER — MEPERIDINE HCL 25 MG/ML IJ SOLN: 6.2500 mg | INTRAMUSCULAR | Status: DC | PRN

## 2015-09-12 MED ORDER — SODIUM CHLORIDE 0.9% FLUSH
3.0000 mL | INTRAVENOUS | Status: DC | PRN
  Administered 2015-09-13: 10 mL via INTRAVENOUS
  Filled 2015-09-12: qty 3

## 2015-09-12 MED ORDER — CEFAZOLIN SODIUM-DEXTROSE 2-4 GM/100ML-% IV SOLN
2.0000 g | INTRAVENOUS | Status: AC
  Administered 2015-09-12: 2 g via INTRAVENOUS

## 2015-09-12 MED ORDER — SODIUM CHLORIDE 0.9 % IJ SOLN
INTRAMUSCULAR | Status: AC
  Administered 2015-09-12: 100 mL
  Filled 2015-09-12: qty 20

## 2015-09-12 MED ORDER — FERROUS SULFATE 325 (65 FE) MG PO TABS
325.0000 mg | ORAL_TABLET | Freq: Two times a day (BID) | ORAL | Status: DC
  Administered 2015-09-13 – 2015-09-15 (×4): 325 mg via ORAL
  Filled 2015-09-12 (×4): qty 1

## 2015-09-12 MED ORDER — COCONUT OIL OIL
1.0000 "application " | TOPICAL_OIL | Status: DC | PRN
  Filled 2015-09-12: qty 120

## 2015-09-12 MED ORDER — LACTATED RINGERS IV SOLN
INTRAVENOUS | Status: DC
  Administered 2015-09-13: 08:00:00 via INTRAVENOUS

## 2015-09-12 MED ORDER — TERBUTALINE SULFATE 1 MG/ML IJ SOLN
INTRAMUSCULAR | Status: AC
  Filled 2015-09-12: qty 1

## 2015-09-12 MED ORDER — DIPHENHYDRAMINE HCL 50 MG/ML IJ SOLN: 12.5000 mg | INTRAMUSCULAR | Status: DC | PRN

## 2015-09-12 MED ORDER — PRENATAL MULTIVITAMIN CH: 1.0000 | ORAL_TABLET | Freq: Every day | ORAL | Status: DC

## 2015-09-12 MED ORDER — PHENYLEPHRINE HCL 10 MG/ML IJ SOLN
INTRAMUSCULAR | Status: DC | PRN
  Administered 2015-09-12 (×6): 100 ug via INTRAVENOUS

## 2015-09-12 SURGICAL SUPPLY — 28 items
3-0 VICRYL ×2 IMPLANT
CANISTER SUCT 3000ML (MISCELLANEOUS) ×2 IMPLANT
CATH KIT ON-Q SILVERSOAK 5IN (CATHETERS) ×4 IMPLANT
DRSG TELFA 3X8 NADH (GAUZE/BANDAGES/DRESSINGS) ×2 IMPLANT
ELECT CAUTERY BLADE 6.4 (BLADE) ×2 IMPLANT
ELECT REM PT RETURN 9FT ADLT (ELECTROSURGICAL) ×2
ELECTRODE REM PT RTRN 9FT ADLT (ELECTROSURGICAL) ×1 IMPLANT
GAUZE SPONGE 4X4 12PLY STRL (GAUZE/BANDAGES/DRESSINGS) ×2 IMPLANT
GLOVE BIO SURGEON STRL SZ7 (GLOVE) ×2 IMPLANT
GLOVE INDICATOR 7.5 STRL GRN (GLOVE) ×2 IMPLANT
GOWN STRL REUS W/ TWL LRG LVL3 (GOWN DISPOSABLE) ×3 IMPLANT
GOWN STRL REUS W/TWL LRG LVL3 (GOWN DISPOSABLE) ×3
LIQUID BAND (GAUZE/BANDAGES/DRESSINGS) ×2 IMPLANT
NS IRRIG 1000ML POUR BTL (IV SOLUTION) ×2 IMPLANT
PACK C SECTION AR (MISCELLANEOUS) ×2 IMPLANT
PAD OB MATERNITY 4.3X12.25 (PERSONAL CARE ITEMS) ×4 IMPLANT
PAD PREP 24X41 OB/GYN DISP (PERSONAL CARE ITEMS) ×2 IMPLANT
SPONGE LAP 18X18 5 PK (GAUZE/BANDAGES/DRESSINGS) ×4 IMPLANT
STRIP CLOSURE SKIN 1/2X4 (GAUZE/BANDAGES/DRESSINGS) ×4 IMPLANT
SUT CHROMIC GUT BROWN 0 54 (SUTURE) IMPLANT
SUT CHROMIC GUT BROWN 0 54IN (SUTURE)
SUT MNCRL 4-0 (SUTURE) ×1
SUT MNCRL 4-0 27XMFL (SUTURE) ×1
SUT PDS AB 1 TP1 96 (SUTURE) ×2 IMPLANT
SUT PLAIN 2 0 XLH (SUTURE) IMPLANT
SUT VIC AB 0 CT1 36 (SUTURE) ×8 IMPLANT
SUTURE MNCRL 4-0 27XMF (SUTURE) ×1 IMPLANT
SWABSTK COMLB BENZOIN TINCTURE (MISCELLANEOUS) ×4 IMPLANT

## 2015-09-12 NOTE — Op Note (Signed)
Cesarean Section Procedure Note   [place] NADALYN DERINGER   09/12/2015   Pre-operative Diagnosis:  1) intrauterine pregnancy at [redacted]w[redacted]d  2) severe preeclampsia 3) non reasuring fetal heart rate.   Post-operative Diagnosis:  1) intrauterine pregnancy at [redacted]w[redacted]d  2) severe preeclampsia 3) non reasuring fetal heart rate.   Procedure: primary low transverse cesarean section via Pfannenstiel incision with double layer uterine closure.   Surgeon: Moishe Spice) and Role:    * Conard Novak, MD - Primary   Anesthesia: spinal   Findings:  1) normal appearing gravid uterus, fallopian tubes, and ovaries 2) viable female infant   Estimated Blood Loss: 750 ml  Total IV Fluids: 1,100 ml   Urine Output: 200 mL  Specimens: None  Complications: no complications  Disposition: PACU - hemodynamically stable.   Maternal Condition: stable   Baby condition / location:  Couplet care / Skin to Skin  Procedure Details:  The patient was seen in the Holding Room. The risks, benefits, complications, treatment options, and expected outcomes were discussed with the patient. The patient concurred with the proposed plan, giving informed consent. identified as Meghen Jerrye Beavers and the procedure verified as C-Section Delivery. A Time Out was held and the above information confirmed.   After induction of anesthesia, the patient was draped and prepped in the usual sterile manner. A Pfannenstiel incision was made and carried down through the subcutaneous tissue to the fascia. Fascial incision was made and extended transversely. The fascia was separated from the underlying rectus tissue superiorly and inferiorly. The peritoneum was identified and entered. Peritoneal incision was extended longitudinally. The bladder flap was bluntly freed from the lower uterine segment. A low transverse uterine incision was made and the hysterotomy was extended with cranial-caudal tension. Delivered from cephalic presentation was a  2,420  gram Living newborn infant(s) or Female with Apgar scores of 8 at one minute and 10 at five minutes. Cord ph was not sent the umbilical cord was clamped and cut cord blood was not obtained for evaluation. The placenta was removed Intact and appeared normal. The uterine outline, tubes and ovaries appeared normal. The uterine incision was closed with running locked sutures of 0 Vicryl.  A second layer of the same suture was thrown in an imbricating fashion.  Hemostasis was assured.  There was noted to be some bleeding a vascular structure at the uterine fundus that was bleeding prior to returning the uterus to the abdomen.  Several stitches of 0 vicryl were thrown to obtain hemostasis.  The uterus was returned to the abdomen and the paracolic gutters were cleared of all clots and debris.  The rectus muscles were inspected and found to be hemostatic.  The On-Q catheter pumps were inserted in accordance with the manufacturer's recommendations.  The catheters were inserted approximately 4cm cephelad to the incision line, approximately 1cm apart, straddling the midline.  They were inserted to a depth of the 3rd mark. They were positioned superficial to the rectus abdominus muscles and deep to the rectus fascia.    The fascia was then reapproximated with running sutures of 1-0 PDS, looped. The subcuticular closure was performed using 4-0 monocryl. The skin closure was reinforced using benzoin and 1/2" steri-strips.  The On-Q catheters were bolused with 5 mL of 0.5% marcaine plain for a total of 10 mL.  The catheters were affixed to the skin with surgical skin glue, steri-strips, and tegaderm.    Instrument, sponge, and needle counts were correct prior the abdominal closure and  were correct at the conclusion of the case.  The patient received Ancef 2 gram IV prior to skin incision (within 30 minutes). For VTE prophylaxis she was wearing SCDs throughout the case.   Signed: Conard NovakStephen D. Rhodesia Stanger, MD 09/12/2015  5:27 PM

## 2015-09-12 NOTE — Discharge Summary (Signed)
OB Discharge Summary  Patient Name: Cindy Lee DOB: 03/01/1991 MRN: [id number]  Date of admission: 09/11/2015 Delivering MD: Thomasene MohairStephen Jackson, MD Date of Delivery: 09/12/2015  Date of discharge: 09/15/2015  Admitting diagnosis:  1) intrauterine pregnancy at 1186w1d  2) severe preeclampsia  Intrauterine pregnancy: 4686w1d      Secondary diagnosis: Preeclampsia     Discharge diagnosis: Term Pregnancy Delivered, severe preeclampsia                                                                                                Post partum procedures:none  Augmentation: unable to augment  Complications: None  Hospital course:  Induction of Labor With Cesarean Section  25 y.o. yo G1P0 at 6786w1d was admitted to the hospital 09/11/2015 for induction of labor. Patient had a labor course significant for prolonged deceleration to 60bpm and this followed later by repetitive late decelerations with minimal variability. The patient went for cesarean section due to Non-Reassuring FHR, and delivered a Viable infant, female with weight of 2,420g and APGARs 8 at 1 minute and 10 at 5 minutes.  Membrane Rupture Time/Date: )4:13 PM ,09/12/2015   Details of operation can be found in separate operative Note.  Postoperatively she was maintained on magnesium for 24 hours.  Patient had an uncomplicated postpartum course. She is ambulating, tolerating a regular diet, passing flatus, and urinating well.  Patient is discharged home in stable condition on 09/15/2015  Physical exam  Filed Vitals:   09/14/15 2126 09/14/15 2300 09/15/15 0250 09/15/15 0750  BP: 138/92 147/79 146/92 139/88  Pulse: 84 87 76 88  Temp:  98.4 F (36.9 C) 97.6 F (36.4 C) 97.9 F (36.6 C)  TempSrc:  Oral Oral Oral  Resp:  20 20 18   Height:      Weight:      SpO2:  99% 99% 99%   General: alert, cooperative and no distress Lochia: appropriate Uterine Fundus: firm Incision: Healing well with no significant drainage DVT Evaluation:  No evidence of DVT seen on physical exam.  Labs: Lab Results  Component Value Date   WBC 11.6* 09/13/2015   HGB 8.9* 09/13/2015   HCT 27.3* 09/13/2015   MCV 84.3 09/13/2015   PLT 241 09/13/2015   CMP Latest Ref Rng 09/11/2015  Glucose 65 - 99 mg/dL 562(Z100(H)  BUN 6 - 20 mg/dL 8  Creatinine 3.080.44 - 6.571.00 mg/dL 8.460.60  Sodium 962135 - 952145 mmol/L 133(L)  Potassium 3.5 - 5.1 mmol/L 3.3(L)  Chloride 101 - 111 mmol/L 107  CO2 22 - 32 mmol/L 17(L)  Calcium 8.9 - 10.3 mg/dL 9.2  Total Protein 6.5 - 8.1 g/dL 6.7  Total Bilirubin 0.3 - 1.2 mg/dL 0.6  Alkaline Phos 38 - 126 U/L 285(H)  AST 15 - 41 U/L 26  ALT 14 - 54 U/L 19    Discharge instruction: per After Visit Summary.  Medications:    Medication List    STOP taking these medications        penicillin v potassium 500 MG tablet  Commonly known as:  VEETID      TAKE these medications  multivitamin-prenatal 27-0.8 MG Tabs tablet  Take 1 tablet by mouth daily at 12 noon.        Diet: routine diet  Activity: Advance as tolerated. Pelvic rest for 6 weeks.   Outpatient follow up: Follow up in 1 week with Dr Jean Rosenthal at [facility] for incision check and Rx for OCPs  Postpartum contraception: Combination OCPs Rhogam Given postpartum: NA Rubella vaccine given postpartum: no Rubella Immune Varicella vaccine given postpartum: no Varicella Immune TDaP given antepartum or postpartum: TDaP UTD Flu vaccine given antepartum or postpartum: no  Newborn Data: Live born female  Birth Weight: 5 lb 5.4 oz (2420 g) APGAR: 8, 10   Baby Feeding: Bottle  Disposition:home with mother  SIGNED: Tresea Mall, CNM   This patient and plan were discussed with Dr Vergie Living 09/15/2015

## 2015-09-12 NOTE — OR Nursing (Signed)
Fundus firm post-op. [facility]

## 2015-09-12 NOTE — Progress Notes (Addendum)
Called to see patient due to deceleration to 60 x 4 minutes.  Intrauterine resuscitative measures undertaken (maternal position changes, maternal O2 admin, maternal IVF bolus). I arrived and at that time the FHR had recovered to 120bpm with moderate variability.  Another deceleration to 90s occurred. The vagina was flushed with 100 mL sterile saline to remove misoprostol.  Terbutaline 0.25mg  [place] administered. FHR stable at 120bpm with moderate variability.  Her vitals were normal with normal BPs and normal pulse and O2 sats. Will allow 20-30 minutes of recovery for mom and baby.  Will consider low-dose pitocin for IOL moving forward. All events explained to patient. She had no questions.  Thomasene MohairStephen Nikitia Asbill, MD 09/12/2015 1:22 PM

## 2015-09-12 NOTE — Anesthesia Preprocedure Evaluation (Signed)
Anesthesia Evaluation  Patient identified by MRN, date of birth, ID band Patient awake    Reviewed: Allergy & Precautions, H&P , NPO status , Patient's Chart, lab work & pertinent test results, reviewed documented beta blocker date and time   History of Anesthesia Complications Negative for: history of anesthetic complications  Airway Mallampati: II  TM Distance: >3 FB Neck ROM: full    Dental no notable dental hx. (+) Missing, Teeth Intact   Pulmonary neg pulmonary ROS, former smoker,    Pulmonary exam normal breath sounds clear to auscultation       Cardiovascular Exercise Tolerance: Good negative cardio ROS Normal cardiovascular exam Rhythm:regular Rate:Normal     Neuro/Psych negative neurological ROS  negative psych ROS   GI/Hepatic Neg liver ROS, GERD  ,  Endo/Other  negative endocrine ROS  Renal/GU negative Renal ROS  negative genitourinary   Musculoskeletal   Abdominal   Peds  Hematology negative hematology ROS (+)   Anesthesia Other Findings History reviewed. No pertinent past medical history.   Reproductive/Obstetrics (+) Pregnancy                             Anesthesia Physical Anesthesia Plan  ASA: II  Anesthesia Plan: Spinal   Post-op Pain Management:    Induction:   Airway Management Planned:   Additional Equipment:   Intra-op Plan:   Post-operative Plan:   Informed Consent: I have reviewed the patients History and Physical, chart, labs and discussed the procedure including the risks, benefits and alternatives for the proposed anesthesia with the patient or authorized representative who has indicated his/her understanding and acceptance.   Dental Advisory Given  Plan Discussed with: Anesthesiologist, CRNA and Surgeon  Anesthesia Plan Comments:         Anesthesia Quick Evaluation

## 2015-09-12 NOTE — Progress Notes (Signed)
Called to see patient due to repetitive late decelerations and minimal variability. Patient does not feel any ctx and has no other symptoms.  BP 125/87 mmHg  Pulse 94  Temp(Src) 98.6 F (37 C) (Oral)  Resp 14  Ht 5\' 4"  (1.626 m)  Wt 70.761 kg (156 lb)  BMI 26.76 kg/m2  SpO2 97%  LMP 12/19/2014  GEN: NAD EFM: 130/min variability/no accels/repetitive late decelerations TOCO: 4-5 q 10 min  A/P: IOL for severe preeclampsia, fetal intolerance of labor Fetus not tolerating any contractions with repetitive late decelerations. She is status post terbutaline x 1 dose a couple of hours ago.  Now with frequent ctx.   Will give another dose of terb now, stat. Discussed need to move asap to OR for cesarean delivery due to fetal tracing. Will continue intrauterine resuscitation until surgery.  Patient explained need for surgery. She voiced understanding and agreement to proceed.  The risks, benefits, and alternatives were explained.    Thomasene MohairStephen Alastair Hennes, MD 09/12/2015 2:51 PM

## 2015-09-12 NOTE — Anesthesia Procedure Notes (Addendum)
Spinal Patient location during procedure: OR Start time: 09/12/2015 3:42 PM End time: 09/12/2015 3:52 PM Staffing Anesthesiologist: Martha Clan Resident/CRNA: Nelda Marseille Performed by: resident/CRNA and anesthesiologist  Preanesthetic Checklist Completed: patient identified, site marked, surgical consent, pre-op evaluation, timeout performed, IV checked, risks and benefits discussed and monitors and equipment checked Spinal Block Patient position: sitting Prep: Betadine Patient monitoring: heart rate, continuous pulse ox, blood pressure and cardiac monitor Approach: midline Location: L4-5 Injection technique: single-shot Needle Needle type: Whitacre and Introducer  Needle gauge: 25 G Needle length: 9 cm Additional Notes Negative paresthesia. Negative blood return. Positive free-flowing CSF. Expiration date of kit checked and confirmed. Patient tolerated procedure well, without complications.    Date/Time: 09/12/2015 4:26 PM Performed by: Nelda Marseille Pre-anesthesia Checklist: Patient identified, Emergency Drugs available, Suction available, Patient being monitored and Timeout performed Oxygen Delivery Method: Nasal cannula

## 2015-09-12 NOTE — Progress Notes (Signed)
Patient ID: Cindy MacadamAshtine K Saraceni, female   DOB: [birth year], 25 y.o.   MRN: [id number] Labor Check  Subj:  Complaints: None. Denies HA, SOB, chest pain   Obj:  BP 143/92 mmHg  Pulse 96  Temp(Src) 98.1 F (36.7 C) (Oral)  Resp 14  Ht 5\' 4"  (1.626 m)  Wt 70.761 kg (156 lb)  BMI 26.76 kg/m2  SpO2 99%  LMP 12/19/2014    Cervix: Dilation: Closed / Effacement (%): Thick / Station: -2  Baseline FHR: 130    Variability: moderate    Accelerations: absent    Decelerations: absent Contractions: present frequency: irregular (3-4 q 10 min)  A/P: 25 y.o. G1P0 female at 2470w1d with severe preeclampsia.  1.  Labor: cytotec 25mcg now. Discussed typical use and that it is not an FDA approved medication.  She agrees to proceed understanding the risks and benefits.  2.  FWB: reassuring overall, Overall assessment: category 1  3.  Pain: prn treatment. None now. Plans epidural 5.  Recheck: prn   Thomasene MohairStephen Helen Cuff, MD 09/12/2015 10:11 AM

## 2015-09-13 LAB — CBC
HCT: 27.3 % — ABNORMAL LOW (ref 35.0–47.0)
Hemoglobin: 8.9 g/dL — ABNORMAL LOW (ref 12.0–16.0)
MCH: 27.5 pg (ref 26.0–34.0)
MCHC: 32.6 g/dL (ref 32.0–36.0)
MCV: 84.3 fL (ref 80.0–100.0)
Platelets: 241 10*3/uL (ref 150–440)
RBC: 3.24 MIL/uL — ABNORMAL LOW (ref 3.80–5.20)
RDW: 15.8 % — ABNORMAL HIGH (ref 11.5–14.5)
WBC: 11.6 10*3/uL — ABNORMAL HIGH (ref 3.6–11.0)

## 2015-09-13 MED ORDER — IBUPROFEN 600 MG PO TABS
600.0000 mg | ORAL_TABLET | Freq: Four times a day (QID) | ORAL | Status: DC | PRN
  Administered 2015-09-13 – 2015-09-15 (×6): 600 mg via ORAL
  Filled 2015-09-13 (×6): qty 1

## 2015-09-13 NOTE — Progress Notes (Signed)
Pericare given, UOP 1900, clear yellow urine, pt. Tolerating a regular diet with no emesis. Infant and FOB remains at the bedside.

## 2015-09-13 NOTE — Anesthesia Post-op Follow-up Note (Signed)
  Anesthesia Pain Follow-up Note  Patient: Cindy Lee  Day #: 1  Date of Follow-up: 09/13/2015 Time: 7:01 PM  Last Vitals:  Filed Vitals:   09/13/15 1635 09/13/15 1800  BP: 139/90 141/95  Pulse: 115 105  Temp:  36.7 C  Resp:  20    Level of Consciousness: alert  Pain: mild   Side Effects:None  Catheter Site Exam:clean  Plan: D/C from anesthesia care  Cleda MccreedyJoseph K Noelia Lenart

## 2015-09-13 NOTE — Anesthesia Postprocedure Evaluation (Signed)
Anesthesia Post Note  Patient: Cindy Lee  Procedure(s) Performed: Procedure(s) (LRB): CESAREAN SECTION (N/A)  Patient location during evaluation: Mother Baby Anesthesia Type: Epidural Level of consciousness: awake and alert Pain management: pain level controlled Vital Signs Assessment: post-procedure vital signs reviewed and stable Respiratory status: spontaneous breathing, nonlabored ventilation and respiratory function stable Cardiovascular status: stable Postop Assessment: no headache, no backache and epidural receding Anesthetic complications: no    Last Vitals:  Filed Vitals:   09/13/15 1635 09/13/15 1800  BP: 139/90 141/95  Pulse: 115 105  Temp:  36.7 C  Resp:  20    Last Pain:  Filed Vitals:   09/13/15 1845  PainSc: 5                  Cleda MccreedyJoseph K Piscitello

## 2015-09-13 NOTE — Progress Notes (Signed)
Pt. Asleep with FOB and infant at the bedside; infant in open crib. UOP 250 ml, clear yellow urine, SCDs in place, IV infusing 100 ml LR and 2g Mag. Sulfate; IV site without s/s infiltrate. Report to be given to day RN at 0700.

## 2015-09-14 NOTE — Progress Notes (Signed)
  Post-operative Day 2  Subjective:  up ad lib, voiding, tolerating PO and + flatus  Objective: Blood pressure 140/84, pulse 76, temperature 98.2 F (36.8 C), temperature source Oral, resp. rate 18, height 5\' 4"  (1.626 m), weight 156 lb (70.761 kg), last menstrual period 12/19/2014, SpO2 100 %  Physical Exam:  General: alert and cooperative Lochia: appropriate Uterine Fundus: firm Incision: healing well, no significant drainage DVT Evaluation: No evidence of DVT seen on physical exam. Abdomen: soft, NT   Recent Labs  09/11/15 1622 09/13/15 0728  HGB 10.5* 8.9*  HCT 31.5* 27.3*    Assessment POD #2, LTCS for FITL, pre-eclampsia with severe features s/p magnesium sulfate  Plan: Discharge tomorrow, Continue PO care and Fe replacement, anemia precautions  Feeding: breast Blood Type: B+ RI/VI TDAP UTD    Marta AntuBrothers, Kevis Qu, [place] 09/14/2015, 10:36 AM

## 2015-09-15 ENCOUNTER — Encounter: Payer: Self-pay | Admitting: Obstetrics and Gynecology

## 2015-09-15 NOTE — Discharge Instructions (Signed)
Incision Care: Keep incision area clean, dry and open to air. Shower daily to prevent infection. Only pat incisions, no rubbing or circling the incision area. Use mild soap and warm water to clean incisions. Make sure to dry area completely. ° °Monitor incision area for redness, severe pain, drainage, or odor, if so contact your physician. ° °No heavy lifting until cleared by physician. ° °Take pain medications to manage pain, if pain is increased or unrelieved by medications contact your physician.  ° °Make sure to stay active, ambulate often. ° °Call your physician if you develop a temperature greater than 100.4, experience any chest pain, shortness of breath. ° °Make sure to follow up with physician within specified time.  °Bleeding: Your bleeding could continue up to 6 weeks, the flow should gradually decrease and the color should become dark then lightened over the next couple of weeks. If you notice you are bleeding heavily or passing clots larger than the size of your fist, PLEASE call your physician. No TAMPONS, DOUCHING, ENEMAS OR SEXUAL INTERCOURSE for 6 weeks.  ° °Stitches: Shower daily with mild soap and water. Stitches will dissolve over the next couple of weeks, if you experience any discomfort in the vaginal area you may sit in warm water 15-20 minutes, 3-4 times per day. Just enough water to cover vaginal area.  ° °AfterPains: This is the uterus contracting back to its normal position and size. Use medications prescribed or recommended by your physician to help relieve this discomfort.  ° °Bowels/Hemorrhoids: Drink plenty of water and stay active. Increase fiber, fresh fruits and vegetables in your diet.  ° °Rest/Activity: Rest when the baby is resting ° °Bathing: Shower daily! ° °Diet: Continue to eat extra calories until your follow up visit to help replenish nutrients and vitamins. If breastfeeding eat an extra 500-1000 and increase your fluid intake to 12 glasses a day.  ° °Contraception: Consult  with your physician on what method of birth control you would like to use.  ° °Postpartum "BLUES": It is common to emotional days after delivery, however if it persist for greater than 2 weeks or if you feel concerned please let your physician know immediately. This is hormone driven and nothing you can control so please let someone know how you feel. ° °Follow Up Visit: Please schedule a follow up visit with your physician.  °

## 2015-09-15 NOTE — Transfer of Care (Cosign Needed)
Immediate Anesthesia Transfer of Care Note  Patient: Cindy Lee  Procedure(s) Performed: Procedure(s): CESAREAN SECTION (N/A)  Patient Location: PACU  Anesthesia Type:Spinal  Level of Consciousness: awake  Airway & Oxygen Therapy: Patient Spontanous Breathing  Post-op Assessment: Report given to RN and Post -op Vital signs reviewed and stable  Post vital signs: Reviewed and stable  Last Vitals:  Filed Vitals:   09/15/15 0250 09/15/15 0750  BP: 146/92 139/88  Pulse: 76 88  Temp: 36.4 C 36.6 C  Resp: 20 18    Complications: No apparent anesthesia complications

## 2015-09-15 NOTE — Progress Notes (Signed)
Patient understands all discharge instructions and the need to make follow up appointments. Patient discharge via wheelchair with auxillary. 

## 2016-05-31 HISTORY — DX: Maternal care for unspecified type scar from previous cesarean delivery: O34.219

## 2016-05-31 NOTE — L&D Delivery Note (Signed)
Obstetrical Delivery Note   Date of Delivery:   04/05/2017 Primary OB:   [facility] Gestational Age/EDD: 6346w4d (Dated by LMP) Antepartum complications: Previous Cesarean section  Delivered By:   Farrel Connersolleen Phinneas Shakoor, CNM  Delivery Type:   spontaneous vaginal delivery / successful VBAC Procedure Details:   Pitocin augmentation/induction begun for preeclampsia without severe features and patient desiring TOLAC. Patient began pushing with fetal station at 0 to +1 and baby in OP presentation. Patient pushed for slightly more than 2 hours delivering a viable female fetus in OP. Baby placed on mother's abdomen and dried. Baby had tachycardia for the last hour of pushing, but mother did not have fever until after baby was born. Baby's temp was 100.8  And mother's temp 101.9. After a delayed cord clamping, the cord was cut by the baby's grandmother.  Baby placed skin to skin and temperature monitored closely. Spontaneous delivery of intact placenta and 3 vessel cord. Repair of second degree laceration with 2-0 Vicryl and 3-0 Chromic after reinforcing rectal sphincter with 2-0 Vicryl x 2 box stitches.  Anesthesia:    epidural Intrapartum complications: preeclampsia without severe features, variable decelerations, fetal tachycardia, maternal fever/ chorio GBS:    negative Laceration:    2nd degree and perineal Episiotomy:    none Placenta:    Via active 3rd stage. To pathology: no Estimated Blood Loss:  400 ml Baby:    Liveborn female, Apgars 8/8, weight 7#6.2oz    Farrel Connersolleen Jacari Kirsten, CNM

## 2016-07-14 ENCOUNTER — Encounter: Payer: Self-pay | Admitting: *Deleted

## 2016-07-14 ENCOUNTER — Emergency Department
Admission: EM | Admit: 2016-07-14 | Discharge: 2016-07-14 | Disposition: A | Discharge status: Home / Self Care | Payer: BLUE CROSS/BLUE SHIELD | Attending: Emergency Medicine | Admitting: Emergency Medicine

## 2016-07-14 DIAGNOSIS — J111 Influenza due to unidentified influenza virus with other respiratory manifestations: Secondary | ICD-10-CM | POA: Insufficient documentation

## 2016-07-14 DIAGNOSIS — Z87891 Personal history of nicotine dependence: Secondary | ICD-10-CM | POA: Diagnosis not present

## 2016-07-14 DIAGNOSIS — R51 Headache: Secondary | ICD-10-CM | POA: Diagnosis present

## 2016-07-14 MED ORDER — OSELTAMIVIR PHOSPHATE 75 MG PO CAPS: 75.0000 mg | ORAL_CAPSULE | Freq: Two times a day (BID) | ORAL | 0 refills | Status: AC

## 2016-07-14 NOTE — ED Provider Notes (Signed)
[facility] Emergency Department Provider Note  ____________________________________________  Time seen: Approximately 6:40 PM  I have reviewed the triage vital signs and the nursing notes.   HISTORY  Chief Complaint Generalized Body Aches and Fever    HPI Cindy Lee is a 26 y.o. female presenting to the emergency department with headache, congestion, rhinorrhea, fatigue, myalgias and diarrhea for the past 2 days. Patient has not taken her temperature. However, she has had chills and sweats. Patient is tolerating fluids by mouth. She has had diminished appetite and increased sleep. She has been taking TheraFlu over-the-counter. Patient denies chest pain, chest tightness, shortness of breath, abdominal pain, nausea and vomiting. Patient currently works at [employer]. She enjoys working out and reading for fun. She has a 641-month-old daughter at home.   History reviewed. No pertinent past medical history.  Patient Active Problem List   Diagnosis Date Noted  . Postpartum care following cesarean delivery 09/15/2015  . Labor and delivery, indication for care 09/11/2015    Past Surgical History:  Procedure Laterality Date  . CESAREAN SECTION N/A 09/12/2015   Procedure: CESAREAN SECTION;  Surgeon: Conard NovakStephen D Jackson, MD;  Location: [facility] ORS;  Service: Obstetrics;  Laterality: N/A;    Prior to Admission medications   Medication Sig Start Date End Date Taking? Authorizing Provider  oseltamivir (TAMIFLU) 75 MG capsule Take 1 capsule (75 mg total) by mouth 2 (two) times daily. 07/14/16 07/19/16  Orvil FeilJaclyn M Tirrell Buchberger, PA-C  Prenatal Vit-Fe Fumarate-FA (MULTIVITAMIN-PRENATAL) 27-0.8 MG TABS tablet Take 1 tablet by mouth daily at 12 noon.    Historical Provider, MD    Allergies Patient has no known allergies.  History reviewed. No pertinent family history.  Social History Social History  Substance Use Topics  . Smoking status: Former Games developermoker  . Smokeless tobacco:  Not on file  . Alcohol use No     Review of Systems  Constitutional: Patient has had fever.  Eyes: No visual changes. No discharge ENT: Patient has had congestion.  Cardiovascular: no chest pain. Respiratory: Patient has had non-productive cough.  No SOB. Gastrointestinal: Patient has had nausea.  Genitourinary: Negative for dysuria. No hematuria Musculoskeletal: Patient has had myalgias. Skin: Negative for rash, abrasions, lacerations, ecchymosis. Neurological: Negative for headaches, focal weakness or numbness.  ____________________________________________   PHYSICAL EXAM:  VITAL SIGNS: ED Triage Vitals [07/14/16 1725]  Enc Vitals Group     BP 133/82     Pulse Rate 87     Resp 18     Temp 99.2 F (37.3 C)     Temp Source Oral     SpO2 99 %     Weight 101 lb (45.8 kg)     Height 5\' 4"  (1.626 m)     Head Circumference      Peak Flow      Pain Score 2     Pain Loc      Pain Edu?      Excl. in GC?     Constitutional: Alert and oriented. Patient is lying supine in bed.  Eyes: Conjunctivae are normal. PERRL. EOMI. Head: Atraumatic. ENT:      Ears: Tympanic membranes are injected bilaterally without evidence of effusion or purulent exudate. Bony landmarks are visualized bilaterally. No pain with palpation at the tragus.      Nose: Nasal turbinates are edematous and erythematous. Trace  rhinorrhea visualized.      Mouth/Throat: Mucous membranes are moist. Posterior pharynx is mildly erythematous. No tonsillar hypertrophy  or purulent exudate. Uvula is midline. Neck: Full range of motion. No pain is elicited with flexion at the neck. Hematological/Lymphatic/Immunilogical: No cervical lymphadenopathy. Cardiovascular: Normal rate, regular rhythm. Normal S1 and S2.  Good peripheral circulation. Respiratory: Normal respiratory effort without tachypnea or retractions. Lungs CTAB. Good air entry to the bases with no decreased or absent breath sounds. Gastrointestinal: Bowel  sounds 4 quadrants. Soft and nontender to palpation. No guarding or rigidity. No palpable masses. No distention. No CVA tenderness.  Skin:  Skin is warm, dry and intact. No rash noted. Psychiatric: Mood and affect are normal. Speech and behavior are normal. Patient exhibits appropriate insight and judgement. ____________________________________________   LABS (all labs ordered are listed, but only abnormal results are displayed)  Labs Reviewed - No data to display ____________________________________________  EKG   ____________________________________________  RADIOLOGY   No results found.  ____________________________________________    PROCEDURES  Procedure(s) performed:    Procedures    Medications - No data to display   ____________________________________________   INITIAL IMPRESSION / ASSESSMENT AND PLAN / ED COURSE  Pertinent labs & imaging results that were available during my care of the patient were reviewed by me and considered in my medical decision making (see chart for details).  Review of the [place] CSRS was performed in accordance of the [facility] prior to dispensing any controlled drugs.     Assessment and Plan:  Influenza: Patient presents to the emergency department with headache, rhinorrhea, congestion, myalgias, fatigue and diarrhea. Symptoms are consistent with influenza. Tamiflu was prescribed at discharge. Rest and hydration were encouraged. Patient was advised to follow-up with her primary care provider in one week. Physical exam and vital signs are reassuring at this time. All patient questions were answered. ____________________________________________  FINAL CLINICAL IMPRESSION(S) / ED DIAGNOSES  Final diagnoses:  Influenza      NEW MEDICATIONS STARTED DURING THIS VISIT:  New Prescriptions   OSELTAMIVIR (TAMIFLU) 75 MG CAPSULE    Take 1 capsule (75 mg total) by mouth 2 (two) times daily.        This chart was dictated using  voice recognition software/Dragon. Despite best efforts to proofread, errors can occur which can change the meaning. Any change was purely unintentional.    Orvil Feil, PA-C 07/14/16 1853    Governor Rooks, MD 07/14/16 2050

## 2016-07-14 NOTE — ED Triage Notes (Signed)
Sates fever, bodyaches and fatigue since Monday, states sore throat and runny nose, awake and alert in no acute distress

## 2016-07-14 NOTE — ED Notes (Signed)
See triage note  States she developed body aches with fever and sore throat on Monday  States fever and sore throat is gone but now feeling weak and fatiqued  Low grade fever on arrival

## 2016-10-15 ENCOUNTER — Ambulatory Visit (INDEPENDENT_AMBULATORY_CARE_PROVIDER_SITE_OTHER): Payer: BLUE CROSS/BLUE SHIELD | Admitting: Advanced Practice Midwife

## 2016-10-15 ENCOUNTER — Encounter: Payer: Self-pay | Admitting: Advanced Practice Midwife

## 2016-10-15 VITALS — BP 104/52 | HR 87 | Wt 104.0 lb

## 2016-10-15 DIAGNOSIS — Z113 Encounter for screening for infections with a predominantly sexual mode of transmission: Secondary | ICD-10-CM

## 2016-10-15 DIAGNOSIS — Z124 Encounter for screening for malignant neoplasm of cervix: Secondary | ICD-10-CM

## 2016-10-15 DIAGNOSIS — Z8759 Personal history of other complications of pregnancy, childbirth and the puerperium: Secondary | ICD-10-CM

## 2016-10-15 DIAGNOSIS — N912 Amenorrhea, unspecified: Secondary | ICD-10-CM

## 2016-10-15 DIAGNOSIS — Z3490 Encounter for supervision of normal pregnancy, unspecified, unspecified trimester: Secondary | ICD-10-CM

## 2016-10-15 DIAGNOSIS — Z3480 Encounter for supervision of other normal pregnancy, unspecified trimester: Secondary | ICD-10-CM

## 2016-10-15 DIAGNOSIS — Z1379 Encounter for other screening for genetic and chromosomal anomalies: Secondary | ICD-10-CM

## 2016-10-15 LAB — POCT URINE PREGNANCY: Preg Test, Ur: POSITIVE — AB

## 2016-10-15 NOTE — Patient Instructions (Signed)

## 2016-10-15 NOTE — Progress Notes (Signed)
NOB 

## 2016-10-15 NOTE — Progress Notes (Signed)
Patient here for NOB visit  Patient ID: Cindy Lee, female   DOB: 08-21-1990, 26 y.o.   MRN: [id number]        New Obstetric Patient H&P    Chief Complaint: "Desires prenatal care"   History of Present Illness: Patient is a 26 y.o. G3P1011 Not Hispanic or Latino female, LMP 07/02/2016 presents with amenorrhea and positive home pregnancy test. Based on her  LMP, her EDD is Estimated Date of Delivery: 04/08/2017. and her EGA is 15 weeks. Cycles are 4. days, regular, and occur approximately every : 35 days. Her last pap smear was 2 years ago and was Negative with +HPV.    She had a urine pregnancy test which was positive 6 weeks  ago. Her last menstrual period was normal and lasted for  4 or 5 day(s). Since her LMP she claims she has experienced fatigue and nausea. She denies vaginal bleeding. Her past medical history is noncontributory. Her prior pregnancies are notable for pre-eclampsia, cesarean delivery  Since her LMP, she admits to the use of tobacco products  no She claims she has gained   7 pounds since the start of her pregnancy.  There are cats in the home in the home  no  She admits close contact with children on a regular basis  yes  She has had chicken pox in the past yes She has had Tuberculosis exposures, symptoms, or previously tested positive for TB   no Current or past history of domestic violence. no  Genetic Screening/Teratology Counseling: (Includes patient, baby's father, or anyone in either family with:)   1. Patient's age >/= 60 at [facility]  no 2. Thalassemia ([place], [place], Mediterranean, or Asian background): MCV<80  no 3. Neural tube defect (meningomyelocele, spina bifida, anencephaly)  no 4. Congenital heart defect  no  5. Down syndrome  no 6. Tay-Sachs (Jewish, [place]  no 7. Canavan's Disease  no 8. Sickle cell disease or trait (African)  no  9. Hemophilia or other blood disorders  no  10. Muscular dystrophy  no  11. Cystic fibrosis  no  12.  Huntington's Chorea  no  13. Mental retardation/autism  no 14. Other inherited genetic or chromosomal disorder  no 15. Maternal metabolic disorder (DM, PKU, etc)  no 16. Patient or FOB with a child with a birth defect not listed above no  16a. Patient or FOB with a birth defect themselves no 17. Recurrent pregnancy loss, or stillbirth  no  18. Any medications since LMP other than prenatal vitamins (include vitamins, supplements, OTC meds, drugs, alcohol)  no 19. Any other genetic/environmental exposure to discuss  no  Infection History:   1. Lives with someone with TB or TB exposed  no  2. Patient or partner has history of genital herpes  no 3. Rash or viral illness since LMP  no 4. History of STI (GC, CT, HPV, syphilis, HIV)  no 5. History of recent travel :  no  Other pertinent information:  no     Review of Systems:10 point review of systems negative unless otherwise noted in HPI  Past Medical History:  History reviewed. No pertinent past medical history.  Past Surgical History:  Past Surgical History:  Procedure Laterality Date  . CESAREAN SECTION N/A 09/12/2015   Procedure: CESAREAN SECTION;  Surgeon: Conard Novak, MD;  Location: [facility] ORS;  Service: Obstetrics;  Laterality: N/A;    Gynecologic History: Patient's last menstrual period was 07/02/2016 (approximate).  Obstetric History: [id number]  Family  History:  Family History  Problem Relation Age of Onset  . Brain cancer Mother [id number]       Benign  . Ovarian cancer Maternal Grandmother [id number]  . Liver cancer Maternal Grandmother   . Breast cancer Paternal Grandmother [phone]  . Colon cancer Paternal Grandmother 1650    Social History:  Social History   Social History  . Marital status: Single    Spouse name: N/A  . Number of children: N/A  . Years of education: N/A   Occupational History  . Not on file.   Social History Main Topics  . Smoking status: Former Games developermoker  . Smokeless tobacco: Never Used  . Alcohol use  No  . Drug use: No  . Sexual activity: Yes   Other Topics Concern  . Not on file   Social History Narrative  . No narrative on file    Allergies:  No Known Allergies  Medications: Prior to Admission medications   Medication Sig Start Date End Date Taking? Authorizing Provider  Prenatal Vit-Fe Fumarate-FA (MULTIVITAMIN-PRENATAL) 27-0.8 MG TABS tablet Take 1 tablet by mouth daily at 12 noon.    [provider]    Physical Exam Vitals: Blood pressure (!) 104/52, pulse 87, weight 104 lb (47.2 kg), last menstrual period 07/02/2016, unknown if currently breastfeeding.  General: NAD HEENT: normocephalic, anicteric Thyroid: no enlargement, no palpable nodules Pulmonary: No increased work of breathing, CTAB Cardiovascular: RRR, distal pulses 2+ Abdomen: NABS, soft, non-tender, non-distended.  Umbilicus without lesions.  No hepatomegaly, splenomegaly or masses palpable. No evidence of hernia  Genitourinary:  External: Normal external female genitalia.  Normal urethral meatus, normal  Bartholin's and Skene's glands.    Vagina: Normal vaginal mucosa, no evidence of prolapse.    Cervix: Grossly normal in appearance, no bleeding, no CMT  Uterus: Enlarged, mobile, normal contour.    Adnexa: ovaries non-enlarged, no adnexal masses  Rectal: deferred Extremities: no edema, erythema, or tenderness Neurologic: Grossly intact Psychiatric: mood appropriate, affect full   Assessment: 26 y.o. G3P1011 at Unknown presenting to initiate prenatal care  Plan: 1) Avoid alcoholic beverages. 2) Patient encouraged not to smoke.  3) Discontinue the use of all non-medicinal drugs and chemicals.  4) Take prenatal vitamins daily.  5) Nutrition, food safety (fish, cheese advisories, and high nitrite foods) and exercise discussed. 6) Hospital and practice style discussed with cross coverage system.  7) Genetic Screening, such as with 1st Trimester Screening, cell free fetal DNA, AFP testing, and  Ultrasound, as well as with amniocentesis and CVS as appropriate, is discussed with patient. At the conclusion of today's visit patient requested genetic testing 8) Patient is asked about travel to areas at risk for the Zika virus, and counseled to avoid travel and exposure to mosquitoes or sexual partners who may have themselves been exposed to the virus. Testing is discussed, and will be ordered as appropriate.   Tresea MallJane Zuleima Haser, CNM

## 2016-10-16 LAB — PROTEIN / CREATININE RATIO, URINE
Creatinine, Urine: 64 mg/dL
Protein, Ur: 5.8 mg/dL
Protein/Creat Ratio: 91 mg/g creat (ref 0–200)

## 2016-10-17 LAB — URINE CULTURE: Organism ID, Bacteria: NO GROWTH

## 2016-10-19 LAB — COMPREHENSIVE METABOLIC PANEL
ALT: 9 IU/L (ref 0–32)
AST: 13 IU/L (ref 0–40)
Albumin/Globulin Ratio: 1.5 (ref 1.2–2.2)
Albumin: 4.2 g/dL (ref 3.5–5.5)
Alkaline Phosphatase: 45 IU/L (ref 39–117)
BUN/Creatinine Ratio: 14 (ref 9–23)
BUN: 7 mg/dL (ref 6–20)
Bilirubin Total: 0.4 mg/dL (ref 0.0–1.2)
CO2: 18 mmol/L (ref 18–29)
Calcium: 9.5 mg/dL (ref 8.7–10.2)
Chloride: 104 mmol/L (ref 96–106)
Creatinine, Ser: 0.49 mg/dL — ABNORMAL LOW (ref 0.57–1.00)
GFR calc Af Amer: 158 mL/min/{1.73_m2} (ref >59)
GFR calc non Af Amer: 137 mL/min/{1.73_m2} (ref >59)
Globulin, Total: 2.8 g/dL (ref 1.5–4.5)
Glucose: 67 mg/dL (ref 65–99)
Potassium: 4.2 mmol/L (ref 3.5–5.2)
Sodium: 137 mmol/L (ref 134–144)
Total Protein: 7 g/dL (ref 6.0–8.5)

## 2016-10-19 LAB — RPR+RH+ABO+RUB AB+AB SCR+CB...
Antibody Screen: NEGATIVE
HIV Screen 4th Generation wRfx: NONREACTIVE
Hematocrit: 35.9 % (ref 34.0–46.6)
Hemoglobin: 12.2 g/dL (ref 11.1–15.9)
Hepatitis B Surface Ag: NEGATIVE
MCH: 31.8 pg (ref 26.6–33.0)
MCHC: 34 g/dL (ref 31.5–35.7)
MCV: 94 fL (ref 79–97)
Platelets: 311 10*3/uL (ref 150–379)
RBC: 3.84 x10E6/uL (ref 3.77–5.28)
RDW: 13.8 % (ref 12.3–15.4)
RPR Ser Ql: NONREACTIVE
Rh Factor: POSITIVE
Rubella Antibodies, IGG: 16.8 index (ref >0.99)
Varicella zoster IgG: 2123 index (ref >165)
WBC: 5.8 10*3/uL (ref 3.4–10.8)

## 2016-10-19 LAB — IGP,CTNGTV,RFX APTIMA HPV ASCU
Chlamydia, Nuc. Acid Amp: NEGATIVE
Gonococcus, Nuc. Acid Amp: NEGATIVE
PAP Smear Comment: 0
Trich vag by NAA: NEGATIVE

## 2016-10-19 LAB — HEMOGLOBINOPATHY EVALUATION
HGB C: 0 %
HGB S: 0 %
HGB VARIANT: 0 %
Hemoglobin A2 Quantitation: 2.6 % (ref 1.8–3.2)
Hemoglobin F Quantitation: 0 % (ref 0.0–2.0)
Hgb A: 97.4 % (ref 96.4–98.8)

## 2016-10-27 ENCOUNTER — Ambulatory Visit (INDEPENDENT_AMBULATORY_CARE_PROVIDER_SITE_OTHER): Payer: BLUE CROSS/BLUE SHIELD

## 2016-10-27 ENCOUNTER — Ambulatory Visit (INDEPENDENT_AMBULATORY_CARE_PROVIDER_SITE_OTHER): Payer: BLUE CROSS/BLUE SHIELD | Admitting: Obstetrics & Gynecology

## 2016-10-27 ENCOUNTER — Other Ambulatory Visit: Payer: Self-pay | Admitting: Advanced Practice Midwife

## 2016-10-27 VITALS — BP 110/60 | Wt 106.0 lb

## 2016-10-27 DIAGNOSIS — Z8759 Personal history of other complications of pregnancy, childbirth and the puerperium: Secondary | ICD-10-CM

## 2016-10-27 DIAGNOSIS — Z362 Encounter for other antenatal screening follow-up: Secondary | ICD-10-CM | POA: Diagnosis not present

## 2016-10-27 DIAGNOSIS — Z3480 Encounter for supervision of other normal pregnancy, unspecified trimester: Secondary | ICD-10-CM

## 2016-10-27 DIAGNOSIS — Z3A16 16 weeks gestation of pregnancy: Secondary | ICD-10-CM

## 2016-10-27 DIAGNOSIS — Z3490 Encounter for supervision of normal pregnancy, unspecified, unspecified trimester: Secondary | ICD-10-CM

## 2016-10-27 NOTE — Progress Notes (Signed)
PNV, US discussed.

## 2016-10-27 NOTE — Patient Instructions (Signed)

## 2016-11-24 ENCOUNTER — Ambulatory Visit (INDEPENDENT_AMBULATORY_CARE_PROVIDER_SITE_OTHER): Payer: BLUE CROSS/BLUE SHIELD | Admitting: Obstetrics and Gynecology

## 2016-11-24 ENCOUNTER — Ambulatory Visit (INDEPENDENT_AMBULATORY_CARE_PROVIDER_SITE_OTHER): Payer: BLUE CROSS/BLUE SHIELD

## 2016-11-24 DIAGNOSIS — Z362 Encounter for other antenatal screening follow-up: Secondary | ICD-10-CM

## 2016-11-24 DIAGNOSIS — Z3A16 16 weeks gestation of pregnancy: Secondary | ICD-10-CM

## 2016-11-24 DIAGNOSIS — Z3480 Encounter for supervision of other normal pregnancy, unspecified trimester: Secondary | ICD-10-CM

## 2016-11-24 NOTE — Progress Notes (Signed)
Anatomy scan  

## 2016-11-24 NOTE — Addendum Note (Signed)
Addended by: Nadara MustardHARRIS, Kymberlee Viger P on: 11/24/2016 08:30 AM   Modules accepted: Orders

## 2016-11-24 NOTE — Progress Notes (Signed)
Anatomy scan complete 

## 2016-12-22 ENCOUNTER — Ambulatory Visit (INDEPENDENT_AMBULATORY_CARE_PROVIDER_SITE_OTHER): Payer: BLUE CROSS/BLUE SHIELD | Admitting: Obstetrics & Gynecology

## 2016-12-22 VITALS — BP 102/60 | Wt 118.0 lb

## 2016-12-22 DIAGNOSIS — Z3A24 24 weeks gestation of pregnancy: Secondary | ICD-10-CM

## 2016-12-22 DIAGNOSIS — Z8759 Personal history of other complications of pregnancy, childbirth and the puerperium: Secondary | ICD-10-CM

## 2016-12-22 DIAGNOSIS — Z3480 Encounter for supervision of other normal pregnancy, unspecified trimester: Secondary | ICD-10-CM

## 2016-12-22 NOTE — Progress Notes (Signed)
PNV, Glc nv 

## 2016-12-22 NOTE — Patient Instructions (Signed)

## 2017-01-17 ENCOUNTER — Other Ambulatory Visit: Payer: Self-pay | Admitting: Obstetrics & Gynecology

## 2017-01-17 DIAGNOSIS — Z8759 Personal history of other complications of pregnancy, childbirth and the puerperium: Secondary | ICD-10-CM

## 2017-01-20 ENCOUNTER — Ambulatory Visit (INDEPENDENT_AMBULATORY_CARE_PROVIDER_SITE_OTHER): Payer: BLUE CROSS/BLUE SHIELD

## 2017-01-20 ENCOUNTER — Ambulatory Visit (INDEPENDENT_AMBULATORY_CARE_PROVIDER_SITE_OTHER): Payer: BLUE CROSS/BLUE SHIELD | Admitting: Obstetrics & Gynecology

## 2017-01-20 VITALS — BP 100/60 | Wt 123.0 lb

## 2017-01-20 DIAGNOSIS — Z3480 Encounter for supervision of other normal pregnancy, unspecified trimester: Secondary | ICD-10-CM

## 2017-01-20 DIAGNOSIS — Z362 Encounter for other antenatal screening follow-up: Secondary | ICD-10-CM | POA: Diagnosis not present

## 2017-01-20 DIAGNOSIS — Z8759 Personal history of other complications of pregnancy, childbirth and the puerperium: Secondary | ICD-10-CM | POA: Diagnosis not present

## 2017-01-20 DIAGNOSIS — Z3A28 28 weeks gestation of pregnancy: Secondary | ICD-10-CM

## 2017-01-20 NOTE — Progress Notes (Signed)
PNV, GLucola soon, [facility], Breast feeding, Minipill or Depo planned

## 2017-01-20 NOTE — Patient Instructions (Signed)
Third Trimester of Pregnancy The third trimester is from week 28 through week 40 (months 7 through 9). The third trimester is a time when the unborn baby (fetus) is growing rapidly. At the end of the ninth month, the fetus is about 20 inches in length and weighs 6-10 pounds. Body changes during your third trimester Your body will continue to go through many changes during pregnancy. The changes vary from woman to woman. During the third trimester:  Your weight will continue to increase. You can expect to gain 25-35 pounds (11-16 kg) by the end of the pregnancy.  You may begin to get stretch marks on your hips, abdomen, and breasts.  You may urinate more often because the fetus is moving lower into your pelvis and pressing on your bladder.  You may develop or continue to have heartburn. This is caused by increased hormones that slow down muscles in the digestive tract.  You may develop or continue to have constipation because increased hormones slow digestion and cause the muscles that push waste through your intestines to relax.  You may develop hemorrhoids. These are swollen veins (varicose veins) in the rectum that can itch or be painful.  You may develop swollen, bulging veins (varicose veins) in your legs.  You may have increased body aches in the pelvis, back, or thighs. This is due to weight gain and increased hormones that are relaxing your joints.  You may have changes in your hair. These can include thickening of your hair, rapid growth, and changes in texture. Some women also have hair loss during or after pregnancy, or hair that feels dry or thin. Your hair will most likely return to normal after your baby is born.  Your breasts will continue to grow and they will continue to become tender. A yellow fluid (colostrum) may leak from your breasts. This is the first milk you are producing for your baby.  Your belly button may stick out.  You may notice more swelling in your hands,  face, or ankles.  You may have increased tingling or numbness in your hands, arms, and legs. The skin on your belly may also feel numb.  You may feel short of breath because of your expanding uterus.  You may have more problems sleeping. This can be caused by the size of your belly, increased need to urinate, and an increase in your body's metabolism.  You may notice the fetus "dropping," or moving lower in your abdomen (lightening).  You may have increased vaginal discharge.  You may notice your joints feel loose and you may have pain around your pelvic bone.  What to expect at prenatal visits You will have prenatal exams every 2 weeks until week 36. Then you will have weekly prenatal exams. During a routine prenatal visit:  You will be weighed to make sure you and the baby are growing normally.  Your blood pressure will be taken.  Your abdomen will be measured to track your baby's growth.  The fetal heartbeat will be listened to.  Any test results from the previous visit will be discussed.  You may have a cervical check near your due date to see if your cervix has softened or thinned (effaced).  You will be tested for Group B streptococcus. This happens between 35 and 37 weeks.  Your health care provider may ask you:  What your birth plan is.  How you are feeling.  If you are feeling the baby move.  If you have had   any abnormal symptoms, such as leaking fluid, bleeding, severe headaches, or abdominal cramping.  If you are using any tobacco products, including cigarettes, chewing tobacco, and electronic cigarettes.  If you have any questions.  Other tests or screenings that may be performed during your third trimester include:  Blood tests that check for low iron levels (anemia).  Fetal testing to check the health, activity level, and growth of the fetus. Testing is done if you have certain medical conditions or if there are problems during the  pregnancy.  Nonstress test (NST). This test checks the health of your baby to make sure there are no signs of problems, such as the baby not getting enough oxygen. During this test, a belt is placed around your belly. The baby is made to move, and its heart rate is monitored during movement.  What is false labor? False labor is a condition in which you feel small, irregular tightenings of the muscles in the womb (contractions) that usually go away with rest, changing position, or drinking water. These are called Braxton Hicks contractions. Contractions may last for hours, days, or even weeks before true labor sets in. If contractions come at regular intervals, become more frequent, increase in intensity, or become painful, you should see your health care provider. What are the signs of labor?  Abdominal cramps.  Regular contractions that start at 10 minutes apart and become stronger and more frequent with time.  Contractions that start on the top of the uterus and spread down to the lower abdomen and back.  Increased pelvic pressure and dull back pain.  A watery or bloody mucus discharge that comes from the vagina.  Leaking of amniotic fluid. This is also known as your "water breaking." It could be a slow trickle or a gush. Let your health care provider know if it has a color or strange odor. If you have any of these signs, call your health care provider right away, even if it is before your due date. Follow these instructions at home: Medicines  Follow your health care provider's instructions regarding medicine use. Specific medicines may be either safe or unsafe to take during pregnancy.  Take a prenatal vitamin that contains at least 600 micrograms (mcg) of folic acid.  If you develop constipation, try taking a stool softener if your health care provider approves. Eating and drinking  Eat a balanced diet that includes fresh fruits and vegetables, whole grains, good sources of protein  such as meat, eggs, or tofu, and low-fat dairy. Your health care provider will help you determine the amount of weight gain that is right for you.  Avoid raw meat and uncooked cheese. These carry germs that can cause birth defects in the baby.  If you have low calcium intake from food, talk to your health care provider about whether you should take a daily calcium supplement.  Eat four or five small meals rather than three large meals a day.  Limit foods that are high in fat and processed sugars, such as fried and sweet foods.  To prevent constipation: ? Drink enough fluid to keep your urine clear or pale yellow. ? Eat foods that are high in fiber, such as fresh fruits and vegetables, whole grains, and beans. Activity  Exercise only as directed by your health care provider. Most women can continue their usual exercise routine during pregnancy. Try to exercise for 30 minutes at least 5 days a week. Stop exercising if you experience uterine contractions.  Avoid heavy   lifting.  Do not exercise in extreme heat or humidity, or at high altitudes.  Wear low-heel, comfortable shoes.  Practice good posture.  You may continue to have sex unless your health care provider tells you otherwise. Relieving pain and discomfort  Take frequent breaks and rest with your legs elevated if you have leg cramps or low back pain.  Take warm sitz baths to soothe any pain or discomfort caused by hemorrhoids. Use hemorrhoid cream if your health care provider approves.  Wear a good support bra to prevent discomfort from breast tenderness.  If you develop varicose veins: ? Wear support pantyhose or compression stockings as told by your healthcare provider. ? Elevate your feet for 15 minutes, 3-4 times a day. Prenatal care  Write down your questions. Take them to your prenatal visits.  Keep all your prenatal visits as told by your health care provider. This is important. Safety  Wear your seat belt at  all times when driving.  Make a list of emergency phone numbers, including numbers for family, friends, the hospital, and police and fire departments. General instructions  Avoid cat litter boxes and soil used by cats. These carry germs that can cause birth defects in the baby. If you have a cat, ask someone to clean the litter box for you.  Do not travel far distances unless it is absolutely necessary and only with the approval of your health care provider.  Do not use hot tubs, steam rooms, or saunas.  Do not drink alcohol.  Do not use any products that contain nicotine or tobacco, such as cigarettes and e-cigarettes. If you need help quitting, ask your health care provider.  Do not use any medicinal herbs or unprescribed drugs. These chemicals affect the formation and growth of the baby.  Do not douche or use tampons or scented sanitary pads.  Do not cross your legs for long periods of time.  To prepare for the arrival of your baby: ? Take prenatal classes to understand, practice, and ask questions about labor and delivery. ? Make a trial run to the hospital. ? Visit the hospital and tour the maternity area. ? Arrange for maternity or paternity leave through employers. ? Arrange for family and friends to take care of pets while you are in the hospital. ? Purchase a rear-facing car seat and make sure you know how to install it in your car. ? Pack your hospital bag. ? Prepare the baby's nursery. Make sure to remove all pillows and stuffed animals from the baby's crib to prevent suffocation.  Visit your dentist if you have not gone during your pregnancy. Use a soft toothbrush to brush your teeth and be gentle when you floss. Contact a health care provider if:  You are unsure if you are in labor or if your water has broken.  You become dizzy.  You have mild pelvic cramps, pelvic pressure, or nagging pain in your abdominal area.  You have lower back pain.  You have persistent  nausea, vomiting, or diarrhea.  You have an unusual or bad smelling vaginal discharge.  You have pain when you urinate. Get help right away if:  Your water breaks before 37 weeks.  You have regular contractions less than 5 minutes apart before 37 weeks.  You have a fever.  You are leaking fluid from your vagina.  You have spotting or bleeding from your vagina.  You have severe abdominal pain or cramping.  You have rapid weight loss or weight gain.    You have shortness of breath with chest pain.  You notice sudden or extreme swelling of your face, hands, ankles, feet, or legs.  Your baby makes fewer than 10 movements in 2 hours.  You have severe headaches that do not go away when you take medicine.  You have vision changes. Summary  The third trimester is from week 28 through week 40, months 7 through 9. The third trimester is a time when the unborn baby (fetus) is growing rapidly.  During the third trimester, your discomfort may increase as you and your baby continue to gain weight. You may have abdominal, leg, and back pain, sleeping problems, and an increased need to urinate.  During the third trimester your breasts will keep growing and they will continue to become tender. A yellow fluid (colostrum) may leak from your breasts. This is the first milk you are producing for your baby.  False labor is a condition in which you feel small, irregular tightenings of the muscles in the womb (contractions) that eventually go away. These are called Braxton Hicks contractions. Contractions may last for hours, days, or even weeks before true labor sets in.  Signs of labor can include: abdominal cramps; regular contractions that start at 10 minutes apart and become stronger and more frequent with time; watery or bloody mucus discharge that comes from the vagina; increased pelvic pressure and dull back pain; and leaking of amniotic fluid. This information is not intended to replace advice  given to you by your health care provider. Make sure you discuss any questions you have with your health care provider. Document Released: 05/11/2001 Document Revised: 10/23/2015 Document Reviewed: 07/18/2012 Elsevier Interactive Patient Education  2017 [employer]  

## 2017-02-07 ENCOUNTER — Ambulatory Visit (INDEPENDENT_AMBULATORY_CARE_PROVIDER_SITE_OTHER): Payer: BLUE CROSS/BLUE SHIELD | Admitting: Obstetrics & Gynecology

## 2017-02-07 ENCOUNTER — Other Ambulatory Visit: Payer: BLUE CROSS/BLUE SHIELD

## 2017-02-07 VITALS — BP 110/70 | Wt 127.0 lb

## 2017-02-07 DIAGNOSIS — Z8759 Personal history of other complications of pregnancy, childbirth and the puerperium: Secondary | ICD-10-CM

## 2017-02-07 DIAGNOSIS — Z3480 Encounter for supervision of other normal pregnancy, unspecified trimester: Secondary | ICD-10-CM

## 2017-02-07 DIAGNOSIS — Z3A31 31 weeks gestation of pregnancy: Secondary | ICD-10-CM

## 2017-02-07 DIAGNOSIS — Z3A28 28 weeks gestation of pregnancy: Secondary | ICD-10-CM

## 2017-02-07 NOTE — Progress Notes (Signed)
PNV, [facility], Glc today CS vs TOLAC discussed.    Desires TOLAC.  CS was for failed IOL for preeclampsia based on FITL.  16 mos ago. Risk of uterine rupture at term is 0.78 percent with TOLAC and 0.22 percent with ERCD. 1 in 10 uterine ruptures will result in neonatal death or neurological injury. The benefits of a trial of labor after cesarean (TOLAC) resulting in a vaginal birth after cesarean (VBAC) include the following: shorter length of hospital stay and postpartum recovery (in most cases); fewer complications, such as postpartum fever, wound or uterine infection, thromboembolism (blood clots in the leg or lung), need for blood transfusion and fewer neonatal breathing problems.  The risks of an attempted VBAC or TOLAC include the following: Risk of failed trial of labor after cesarean (TOLAC) without a vaginal birth after cesarean (VBAC) resulting in repeat cesarean delivery (RCD) in about 20 to 40 percent of women who attempt VBAC.  Risk of rupture of uterus resulting in an emergency cesarean delivery. The risk of uterine rupture may be related in part to the type of uterine incision made during the first cesarean delivery. A previous transverse uterine incision has the lowest risk of rupture (0.2 to 1.5 percent risk). Vertical or T-shaped uterine incisions have a higher risk of uterine rupture (4 to 9 percent risk)The risk of fetal death is very low with both VBAC and elective repeat cesarean delivery (ERCD), but the likelihood of fetal death is higher with VBAC than with ERCD. Maternal death is very rare with either type of delivery. The risks of an elective repeat cesarean delivery (ERCD) were reviewed with the patient including but not limited to: 07/998 risk of uterine rupture which could have serious consequences, bleeding which may require transfusion; infection which may require antibiotics; injury to bowel, bladder or other surrounding organs (bowel, bladder, ureters); injury to the fetus; need  for additional procedures including hysterectomy in the event of a life-threatening hemorrhage; thromboembolic phenomenon; abnormal placentation; incisional problems; death and other postoperative or anesthesia complications.    Annamarie MajorPaul Harris, MD, Merlinda FrederickFACOG [facility], [facility] 02/07/2017  3:41 PM

## 2017-02-07 NOTE — Patient Instructions (Signed)
Third Trimester of Pregnancy The third trimester is from week 28 through week 40 (months 7 through 9). The third trimester is a time when the unborn baby (fetus) is growing rapidly. At the end of the ninth month, the fetus is about 20 inches in length and weighs 6-10 pounds. Body changes during your third trimester Your body will continue to go through many changes during pregnancy. The changes vary from woman to woman. During the third trimester:  Your weight will continue to increase. You can expect to gain 25-35 pounds (11-16 kg) by the end of the pregnancy.  You may begin to get stretch marks on your hips, abdomen, and breasts.  You may urinate more often because the fetus is moving lower into your pelvis and pressing on your bladder.  You may develop or continue to have heartburn. This is caused by increased hormones that slow down muscles in the digestive tract.  You may develop or continue to have constipation because increased hormones slow digestion and cause the muscles that push waste through your intestines to relax.  You may develop hemorrhoids. These are swollen veins (varicose veins) in the rectum that can itch or be painful.  You may develop swollen, bulging veins (varicose veins) in your legs.  You may have increased body aches in the pelvis, back, or thighs. This is due to weight gain and increased hormones that are relaxing your joints.  You may have changes in your hair. These can include thickening of your hair, rapid growth, and changes in texture. Some women also have hair loss during or after pregnancy, or hair that feels dry or thin. Your hair will most likely return to normal after your baby is born.  Your breasts will continue to grow and they will continue to become tender. A yellow fluid (colostrum) may leak from your breasts. This is the first milk you are producing for your baby.  Your belly button may stick out.  You may notice more swelling in your hands,  face, or ankles.  You may have increased tingling or numbness in your hands, arms, and legs. The skin on your belly may also feel numb.  You may feel short of breath because of your expanding uterus.  You may have more problems sleeping. This can be caused by the size of your belly, increased need to urinate, and an increase in your body's metabolism.  You may notice the fetus "dropping," or moving lower in your abdomen (lightening).  You may have increased vaginal discharge.  You may notice your joints feel loose and you may have pain around your pelvic bone.  What to expect at prenatal visits You will have prenatal exams every 2 weeks until week 36. Then you will have weekly prenatal exams. During a routine prenatal visit:  You will be weighed to make sure you and the baby are growing normally.  Your blood pressure will be taken.  Your abdomen will be measured to track your baby's growth.  The fetal heartbeat will be listened to.  Any test results from the previous visit will be discussed.  You may have a cervical check near your due date to see if your cervix has softened or thinned (effaced).  You will be tested for Group B streptococcus. This happens between 35 and 37 weeks.  Your health care provider may ask you:  What your birth plan is.  How you are feeling.  If you are feeling the baby move.  If you have had   any abnormal symptoms, such as leaking fluid, bleeding, severe headaches, or abdominal cramping.  If you are using any tobacco products, including cigarettes, chewing tobacco, and electronic cigarettes.  If you have any questions.  Other tests or screenings that may be performed during your third trimester include:  Blood tests that check for low iron levels (anemia).  Fetal testing to check the health, activity level, and growth of the fetus. Testing is done if you have certain medical conditions or if there are problems during the  pregnancy.  Nonstress test (NST). This test checks the health of your baby to make sure there are no signs of problems, such as the baby not getting enough oxygen. During this test, a belt is placed around your belly. The baby is made to move, and its heart rate is monitored during movement.  What is false labor? False labor is a condition in which you feel small, irregular tightenings of the muscles in the womb (contractions) that usually go away with rest, changing position, or drinking water. These are called Braxton Hicks contractions. Contractions may last for hours, days, or even weeks before true labor sets in. If contractions come at regular intervals, become more frequent, increase in intensity, or become painful, you should see your health care provider. What are the signs of labor?  Abdominal cramps.  Regular contractions that start at 10 minutes apart and become stronger and more frequent with time.  Contractions that start on the top of the uterus and spread down to the lower abdomen and back.  Increased pelvic pressure and dull back pain.  A watery or bloody mucus discharge that comes from the vagina.  Leaking of amniotic fluid. This is also known as your "water breaking." It could be a slow trickle or a gush. Let your health care provider know if it has a color or strange odor. If you have any of these signs, call your health care provider right away, even if it is before your due date. Follow these instructions at home: Medicines  Follow your health care provider's instructions regarding medicine use. Specific medicines may be either safe or unsafe to take during pregnancy.  Take a prenatal vitamin that contains at least 600 micrograms (mcg) of folic acid.  If you develop constipation, try taking a stool softener if your health care provider approves. Eating and drinking  Eat a balanced diet that includes fresh fruits and vegetables, whole grains, good sources of protein  such as meat, eggs, or tofu, and low-fat dairy. Your health care provider will help you determine the amount of weight gain that is right for you.  Avoid raw meat and uncooked cheese. These carry germs that can cause birth defects in the baby.  If you have low calcium intake from food, talk to your health care provider about whether you should take a daily calcium supplement.  Eat four or five small meals rather than three large meals a day.  Limit foods that are high in fat and processed sugars, such as fried and sweet foods.  To prevent constipation: ? Drink enough fluid to keep your urine clear or pale yellow. ? Eat foods that are high in fiber, such as fresh fruits and vegetables, whole grains, and beans. Activity  Exercise only as directed by your health care provider. Most women can continue their usual exercise routine during pregnancy. Try to exercise for 30 minutes at least 5 days a week. Stop exercising if you experience uterine contractions.  Avoid heavy   lifting.  Do not exercise in extreme heat or humidity, or at high altitudes.  Wear low-heel, comfortable shoes.  Practice good posture.  You may continue to have sex unless your health care provider tells you otherwise. Relieving pain and discomfort  Take frequent breaks and rest with your legs elevated if you have leg cramps or low back pain.  Take warm sitz baths to soothe any pain or discomfort caused by hemorrhoids. Use hemorrhoid cream if your health care provider approves.  Wear a good support bra to prevent discomfort from breast tenderness.  If you develop varicose veins: ? Wear support pantyhose or compression stockings as told by your healthcare provider. ? Elevate your feet for 15 minutes, 3-4 times a day. Prenatal care  Write down your questions. Take them to your prenatal visits.  Keep all your prenatal visits as told by your health care provider. This is important. Safety  Wear your seat belt at  all times when driving.  Make a list of emergency phone numbers, including numbers for family, friends, the hospital, and police and fire departments. General instructions  Avoid cat litter boxes and soil used by cats. These carry germs that can cause birth defects in the baby. If you have a cat, ask someone to clean the litter box for you.  Do not travel far distances unless it is absolutely necessary and only with the approval of your health care provider.  Do not use hot tubs, steam rooms, or saunas.  Do not drink alcohol.  Do not use any products that contain nicotine or tobacco, such as cigarettes and e-cigarettes. If you need help quitting, ask your health care provider.  Do not use any medicinal herbs or unprescribed drugs. These chemicals affect the formation and growth of the baby.  Do not douche or use tampons or scented sanitary pads.  Do not cross your legs for long periods of time.  To prepare for the arrival of your baby: ? Take prenatal classes to understand, practice, and ask questions about labor and delivery. ? Make a trial run to the hospital. ? Visit the hospital and tour the maternity area. ? Arrange for maternity or paternity leave through employers. ? Arrange for family and friends to take care of pets while you are in the hospital. ? Purchase a rear-facing car seat and make sure you know how to install it in your car. ? Pack your hospital bag. ? Prepare the baby's nursery. Make sure to remove all pillows and stuffed animals from the baby's crib to prevent suffocation.  Visit your dentist if you have not gone during your pregnancy. Use a soft toothbrush to brush your teeth and be gentle when you floss. Contact a health care provider if:  You are unsure if you are in labor or if your water has broken.  You become dizzy.  You have mild pelvic cramps, pelvic pressure, or nagging pain in your abdominal area.  You have lower back pain.  You have persistent  nausea, vomiting, or diarrhea.  You have an unusual or bad smelling vaginal discharge.  You have pain when you urinate. Get help right away if:  Your water breaks before 37 weeks.  You have regular contractions less than 5 minutes apart before 37 weeks.  You have a fever.  You are leaking fluid from your vagina.  You have spotting or bleeding from your vagina.  You have severe abdominal pain or cramping.  You have rapid weight loss or weight gain.    You have shortness of breath with chest pain.  You notice sudden or extreme swelling of your face, hands, ankles, feet, or legs.  Your baby makes fewer than 10 movements in 2 hours.  You have severe headaches that do not go away when you take medicine.  You have vision changes. Summary  The third trimester is from week 28 through week 40, months 7 through 9. The third trimester is a time when the unborn baby (fetus) is growing rapidly.  During the third trimester, your discomfort may increase as you and your baby continue to gain weight. You may have abdominal, leg, and back pain, sleeping problems, and an increased need to urinate.  During the third trimester your breasts will keep growing and they will continue to become tender. A yellow fluid (colostrum) may leak from your breasts. This is the first milk you are producing for your baby.  False labor is a condition in which you feel small, irregular tightenings of the muscles in the womb (contractions) that eventually go away. These are called Braxton Hicks contractions. Contractions may last for hours, days, or even weeks before true labor sets in.  Signs of labor can include: abdominal cramps; regular contractions that start at 10 minutes apart and become stronger and more frequent with time; watery or bloody mucus discharge that comes from the vagina; increased pelvic pressure and dull back pain; and leaking of amniotic fluid. This information is not intended to replace advice  given to you by your health care provider. Make sure you discuss any questions you have with your health care provider. Document Released: 05/11/2001 Document Revised: 10/23/2015 Document Reviewed: 07/18/2012 Elsevier Interactive Patient Education  2017 [employer]  

## 2017-02-08 LAB — 28 WEEK RH+PANEL
Basophils Absolute: 0 10*3/uL (ref 0.0–0.2)
Basos: 0 %
EOS (ABSOLUTE): 0 10*3/uL (ref 0.0–0.4)
Eos: 1 %
Gestational Diabetes Screen: 132 mg/dL (ref 65–139)
HIV Screen 4th Generation wRfx: NONREACTIVE
Hematocrit: 31.9 % — ABNORMAL LOW (ref 34.0–46.6)
Hemoglobin: 10.4 g/dL — ABNORMAL LOW (ref 11.1–15.9)
Immature Grans (Abs): 0.1 10*3/uL (ref 0.0–0.1)
Immature Granulocytes: 2 %
Lymphocytes Absolute: 1 10*3/uL (ref 0.7–3.1)
Lymphs: 19 %
MCH: 28.7 pg (ref 26.6–33.0)
MCHC: 32.6 g/dL (ref 31.5–35.7)
MCV: 88 fL (ref 79–97)
Monocytes Absolute: 0.5 10*3/uL (ref 0.1–0.9)
Monocytes: 9 %
Neutrophils Absolute: 3.8 10*3/uL (ref 1.4–7.0)
Neutrophils: 69 %
Platelets: 270 10*3/uL (ref 150–379)
RBC: 3.63 x10E6/uL — ABNORMAL LOW (ref 3.77–5.28)
RDW: 12.6 % (ref 12.3–15.4)
RPR Ser Ql: NONREACTIVE
WBC: 5.5 10*3/uL (ref 3.4–10.8)

## 2017-02-09 ENCOUNTER — Emergency Department
Admission: EM | Admit: 2017-02-09 | Discharge: 2017-02-09 | Disposition: A | Discharge status: Home / Self Care | Payer: BLUE CROSS/BLUE SHIELD | Attending: Emergency Medicine | Admitting: Emergency Medicine

## 2017-02-09 ENCOUNTER — Emergency Department
Admission: EM | Admit: 2017-02-09 | Discharge: 2017-02-10 | Disposition: A | Discharge status: Home / Self Care | Payer: BLUE CROSS/BLUE SHIELD | Source: Home / Self Care

## 2017-02-09 ENCOUNTER — Encounter: Payer: Self-pay | Admitting: Emergency Medicine

## 2017-02-09 DIAGNOSIS — Z87891 Personal history of nicotine dependence: Secondary | ICD-10-CM | POA: Diagnosis not present

## 2017-02-09 DIAGNOSIS — O2686 Pruritic urticarial papules and plaques of pregnancy (PUPPP): Secondary | ICD-10-CM | POA: Insufficient documentation

## 2017-02-09 DIAGNOSIS — R21 Rash and other nonspecific skin eruption: Secondary | ICD-10-CM

## 2017-02-09 DIAGNOSIS — L299 Pruritus, unspecified: Secondary | ICD-10-CM | POA: Diagnosis present

## 2017-02-09 DIAGNOSIS — Z5321 Procedure and treatment not carried out due to patient leaving prior to being seen by health care provider: Secondary | ICD-10-CM | POA: Insufficient documentation

## 2017-02-09 DIAGNOSIS — O99713 Diseases of the skin and subcutaneous tissue complicating pregnancy, third trimester: Secondary | ICD-10-CM

## 2017-02-09 MED ORDER — DIPHENHYDRAMINE HCL 25 MG PO CAPS: 25.0000 mg | ORAL_CAPSULE | ORAL | 2 refills | Status: DC | PRN

## 2017-02-09 NOTE — ED Provider Notes (Signed)
[facility] Emergency Department Provider Note   ____________________________________________   First MD Initiated Contact with Patient 02/09/17 1033     (approximate)  I have reviewed the triage vital signs and the nursing notes.   HISTORY  Chief Complaint Pruritis    HPI Cindy Lee is a 26 y.o. female patient complaining of itching to bilateral upper and lower extremity for several weeks. Patient stated no relief over-the-counter topical applications. Patient denies visual rash. Patient denies new trigger for her complaint.   History reviewed. No pertinent past medical history.  Patient Active Problem List   Diagnosis Date Noted  . Supervision of other normal pregnancy, antepartum 10/15/2016  . History of gestational hypertension 10/15/2016    Past Surgical History:  Procedure Laterality Date  . CESAREAN SECTION N/A 09/12/2015   Procedure: CESAREAN SECTION;  Surgeon: Conard NovakStephen D Jackson, MD;  Location: [facility] ORS;  Service: Obstetrics;  Laterality: N/A;    Prior to Admission medications   Medication Sig Start Date End Date Taking? Authorizing Provider  diphenhydrAMINE (BENADRYL) 25 mg capsule Take 1 capsule (25 mg total) by mouth every 4 (four) hours as needed for itching. 02/09/17 02/09/18  Joni ReiningSmith, Devondre Guzzetta K, PA-C  Prenatal Vit-Fe Fumarate-FA (MULTIVITAMIN-PRENATAL) 27-0.8 MG TABS tablet Take 1 tablet by mouth daily at 12 noon.    [provider]    Allergies Patient has no known allergies.  Family History  Problem Relation Age of Onset  . Brain cancer Mother 7754       Benign  . Ovarian cancer Maternal Grandmother 5763  . Liver cancer Maternal Grandmother   . Breast cancer Paternal Grandmother 143  . Colon cancer Paternal Grandmother 7750    Social History Social History  Substance Use Topics  . Smoking status: Former Games developermoker  . Smokeless tobacco: Never Used  . Alcohol use No    Review of Systems  Constitutional: No  fever/chills Eyes: No visual changes. ENT: No sore throat. Cardiovascular: Denies chest pain. Respiratory: Denies shortness of breath. Gastrointestinal: No abdominal pain.  No nausea, no vomiting.  No diarrhea.  No constipation. Genitourinary: Negative for dysuria. Musculoskeletal: Negative for back pain. Skin: Negative for rash. Neurological: Negative for headaches, focal weakness or numbness.   ____________________________________________   PHYSICAL EXAM:  VITAL SIGNS: ED Triage Vitals [02/09/17 1033]  Enc Vitals Group     BP      Pulse      Resp      Temp      Temp src      SpO2      Weight 127 lb (57.6 kg)     Height 5\' 4"  (1.626 m)     Head Circumference      Peak Flow      Pain Score      Pain Loc      Pain Edu?      Excl. in GC?     Constitutional: Alert and oriented. Well appearing and in no acute distress. Cardiovascular: Normal rate, regular rhythm. Grossly normal heart sounds.  Good peripheral circulation. Respiratory: Normal respiratory effort.  No retractions. Lungs CTAB. Musculoskeletal: No lower extremity tenderness nor edema.  No joint effusions. Neurologic:  Normal speech and language. No gross focal neurologic deficits are appreciated. No gait instability. Skin:  Skin is warm, dry and intact. No rash noted. Psychiatric: Mood and affect are normal. Speech and behavior are normal.  ____________________________________________   LABS (all labs ordered are listed, but only abnormal results are  displayed)  Labs Reviewed - No data to display ____________________________________________  EKG   ____________________________________________  RADIOLOGY  No results found.  ____________________________________________   PROCEDURES  Procedure(s) performed: None  Procedures  Critical Care performed: No  ____________________________________________   INITIAL IMPRESSION / ASSESSMENT AND PLAN / ED COURSE  Pertinent labs & imaging results  that were available during my care of the patient were reviewed by me and considered in my medical decision making (see chart for details).  Pruritus secondary to gestational state. Patient given discharge care instructions. Patient advised to try Benadryl 25 mg as directed. Patient advised follow-up in 5 days if no improvement.      ____________________________________________   FINAL CLINICAL IMPRESSION(S) / ED DIAGNOSES  Final diagnoses:  Pruritus of pregnancy in third trimester      NEW MEDICATIONS STARTED DURING THIS VISIT:  Current Discharge Medication List    START taking these medications   Details  diphenhydrAMINE (BENADRYL) 25 mg capsule Take 1 capsule (25 mg total) by mouth every 4 (four) hours as needed for itching. Qty: 30 capsule, Refills: 2         Note:  This document was prepared using Dragon voice recognition software and may include unintentional dictation errors.    Joni Reining, PA-C 02/09/17 1108    Nita Sickle, MD 02/09/17 [phone]

## 2017-02-09 NOTE — ED Triage Notes (Signed)
Presents with itching to both arms and legs for several weeks  No rash noted

## 2017-02-09 NOTE — ED Triage Notes (Signed)
Patient ambulatory to triage with steady gait, without difficulty or distress noted; pt reports "skin irritation" x week; st generalized itching; taking benadryl without relief

## 2017-02-10 ENCOUNTER — Ambulatory Visit (INDEPENDENT_AMBULATORY_CARE_PROVIDER_SITE_OTHER): Payer: BLUE CROSS/BLUE SHIELD | Admitting: Obstetrics & Gynecology

## 2017-02-10 VITALS — BP 110/60 | Wt 126.0 lb

## 2017-02-10 DIAGNOSIS — L299 Pruritus, unspecified: Secondary | ICD-10-CM | POA: Diagnosis not present

## 2017-02-10 MED ORDER — ZOLPIDEM TARTRATE 10 MG PO TABS: 10.0000 mg | ORAL_TABLET | Freq: Every evening | ORAL | 1 refills | Status: DC | PRN

## 2017-02-10 NOTE — ED Notes (Signed)
Pt called from lobby with no response. 

## 2017-02-10 NOTE — Progress Notes (Signed)
HPI:Pt reports itchy skin over the last week, but has no rash.  Worse over last 2 days.  Cannot sleep due to it.  Mostly torso, also face.  Benedryl no help.  Seenin ER even for it.  Pregnant [redacted] weeks, good FM, no concerns there.   PMHx: She  has no past medical history on file. Also,  has a past surgical history that includes Cesarean section (N/A, 09/12/2015)., family history includes Brain cancer (age of onset: 7754) in her mother; Breast cancer (age of onset: 6843) in her paternal grandmother; Colon cancer (age of onset: 4150) in her paternal grandmother; Liver cancer in her maternal grandmother; Ovarian cancer (age of onset: 5563) in her maternal grandmother.,  reports that she has quit smoking. She has never used smokeless tobacco. She reports that she does not drink alcohol or use drugs.  She has a current medication list which includes the following prescription(s): diphenhydramine, multivitamin-prenatal, and zolpidem. Also, has No Known Allergies.  Review of Systems  Constitutional: Negative for chills, fever and malaise/fatigue.  HENT: Negative for congestion, sinus pain and sore throat.   Eyes: Negative for blurred vision and pain.  Respiratory: Negative for cough and wheezing.   Cardiovascular: Negative for chest pain and leg swelling.  Gastrointestinal: Negative for abdominal pain, constipation, diarrhea, heartburn, nausea and vomiting.  Genitourinary: Negative for dysuria, frequency, hematuria and urgency.  Musculoskeletal: Negative for back pain, joint pain, myalgias and neck pain.  Skin: Negative for itching and rash.  Neurological: Negative for dizziness, tremors and weakness.  Endo/Heme/Allergies: Does not bruise/bleed easily.  Psychiatric/Behavioral: Negative for depression. The patient is not nervous/anxious and does not have insomnia.     Objective: BP 110/60   Wt 126 lb (57.2 kg)   LMP 07/02/2016 (Approximate)   BMI 21.63 kg/m  Physical Exam  Constitutional: She is oriented  to person, place, and time. She appears well-developed and well-nourished. No distress.  Musculoskeletal: Normal range of motion.  Neurological: She is alert and oriented to person, place, and time.  Skin: Skin is warm and dry.  Psychiatric: She has a normal mood and affect.  Vitals reviewed. No rash FHT 140s  ASSESSMENT/PLAN:   Problem List Items Addressed This Visit      Musculoskeletal and Integument   Pruritus - Primary   Relevant Orders   Bile acids, total    Cont Benedryl. Rx Ambien for sleep, short term therapy Risks discussed of Ambien, info gv  Annamarie MajorPaul Dashon Mcintire, MD, Merlinda FrederickFACOG [facility], [facility] 02/10/2017  9:18 AM

## 2017-02-10 NOTE — Patient Instructions (Signed)
Zolpidem tablets What is this medicine? ZOLPIDEM (zole PI dem) is used to treat insomnia. This medicine helps you to fall asleep and sleep through the night. This medicine may be used for other purposes; ask your health care provider or pharmacist if you have questions. COMMON BRAND NAME(S): Ambien What should I tell my health care provider before I take this medicine? They need to know if you have any of these conditions: -depression -history of drug abuse or addiction -if you often drink alcohol -liver disease -lung or breathing disease -myasthenia gravis -sleep apnea -suicidal thoughts, plans, or attempt; a previous suicide attempt by you or a family member -an unusual or allergic reaction to zolpidem, other medicines, foods, dyes, or preservatives -pregnant or trying to get pregnant-   OK To Take -breast-feeding How should I use this medicine? Take this medicine by mouth with a glass of water. Follow the directions on the prescription label. It is better to take this medicine on an empty stomach and only when you are ready for bed. Do not take your medicine more often than directed. If you have been taking this medicine for several weeks and suddenly stop taking it, you may get unpleasant withdrawal symptoms. Your doctor or health care professional may want to gradually reduce the dose. Do not stop taking this medicine on your own. Always follow your doctor or health care professional's advice. A special MedGuide will be given to you by the pharmacist with each prescription and refill. Be sure to read this information carefully each time. Talk to your pediatrician regarding the use of this medicine in children. Special care may be needed. Overdosage: If you think you have taken too much of this medicine contact a poison control center or emergency room at once. NOTE: This medicine is only for you. Do not share this medicine with others. What if I miss a dose? This does not apply. This  medicine should only be taken immediately before going to sleep. Do not take double or extra doses. What may interact with this medicine? -alcohol -antihistamines for allergy, cough and cold -certain medicines for anxiety or sleep -certain medicines for depression, like amitriptyline, fluoxetine, sertraline -certain medicines for fungal infections like ketoconazole and itraconazole -certain medicines for seizures like phenobarbital, primidone -ciprofloxacin -dietary supplements for sleep, like valerian or kava kava -general anesthetics like halothane, isoflurane, methoxyflurane, propofol -local anesthetics like lidocaine, pramoxine, tetracaine -medicines that relax muscles for surgery -narcotic medicines for pain -phenothiazines like chlorpromazine, mesoridazine, prochlorperazine, thioridazine -rifampin This list may not describe all possible interactions. Give your health care provider a list of all the medicines, herbs, non-prescription drugs, or dietary supplements you use. Also tell them if you smoke, drink alcohol, or use illegal drugs. Some items may interact with your medicine. What should I watch for while using this medicine? Visit your doctor or health care professional for regular checks on your progress. Keep a regular sleep schedule by going to bed at about the same time each night. Avoid caffeine-containing drinks in the evening hours. When sleep medicines are used every night for more than a few weeks, they may stop working. Talk to your doctor if you still have trouble sleeping. After taking this medicine for sleep, you may get up out of bed while not being fully awake and do an activity that you do not know you are doing. The next morning, you may have no memory of the event. Activities such as driving a car ("sleep-driving"), making and eating food, talking  on the phone, sexual activity, and sleep-walking have been reported. Call your doctor right away if you find out you have  done any of these activities. Do not take this medicine if you have used alcohol that evening or before bed or taken another medicine for sleep since your risk of doing these sleep-related activities will be increased. Wait for at least 8 hours after you take a dose before driving or doing other activities that require full mental alertness. Do not take this medicine unless you are able to stay in bed for a full night (7 to 8 hours) before you must be active again. You may have a decrease in mental alertness the day after use, even if you feel that you are fully awake. Tell your doctor if you will need to perform activities requiring full alertness, such as driving, the next day. Do not stand or sit up quickly after taking this medicine, especially if you are an older patient. This reduces the risk of dizzy or fainting spells. If you or your family notice any changes in your behavior, such as new or worsening depression, thoughts of harming yourself, anxiety, other unusual or disturbing thoughts, or memory loss, call your doctor right away. After you stop taking this medicine, you may have trouble falling asleep. This is called rebound insomnia. This problem usually goes away on its own after 1 or 2 nights. What side effects may I notice from receiving this medicine? Side effects that you should report to your doctor or health care professional as soon as possible: -allergic reactions like skin rash, itching or hives, swelling of the face, lips, or tongue -breathing problems -changes in vision -confusion -depressed mood or other changes in moods or emotions -feeling faint or lightheaded, falls -hallucinations -loss of balance or coordination -loss of memory -numbness or tingling of the tongue -restlessness, excitability, or feelings of anxiety or agitation -signs and symptoms of liver injury like dark yellow or brown urine; general ill feeling or flu-like symptoms; light-colored stools; loss of  appetite; nausea; right upper belly pain; unusually weak or tired; yellowing of the eyes or skin -suicidal thoughts -unusual activities while asleep like driving, eating, making phone calls, or sexual activity Side effects that usually do not require medical attention (report to your doctor or health care professional if they continue or are bothersome): -dizziness -drowsiness the day after you take this medicine -headache This list may not describe all possible side effects. Call your doctor for medical advice about side effects. You may report side effects to FDA at 1-800-FDA-1088. Where should I keep my medicine? Keep out of the reach of children. This medicine can be abused. Keep your medicine in a safe place to protect it from theft. Do not share this medicine with anyone. Selling or giving away this medicine is dangerous and against the law. This medicine may cause accidental overdose and death if taken by other adults, children, or pets. Mix any unused medicine with a substance like cat litter or coffee grounds. Then throw the medicine away in a sealed container like a sealed bag or a coffee can with a lid. Do not use the medicine after the expiration date. Store at room temperature between 20 and 25 degrees C (68 and 77 degrees F). NOTE: This sheet is a summary. It may not cover all possible information. If you have questions about this medicine, talk to your doctor, pharmacist, or health care provider.  2018 Elsevier/Gold Standard (2015-08-20 14:38:20)

## 2017-02-11 LAB — BILE ACIDS, TOTAL: Bile Acids Total: 9.6 umol/L (ref 4.7–24.5)

## 2017-02-21 ENCOUNTER — Ambulatory Visit (INDEPENDENT_AMBULATORY_CARE_PROVIDER_SITE_OTHER): Payer: BLUE CROSS/BLUE SHIELD | Admitting: Maternal Newborn

## 2017-02-21 VITALS — BP 98/58 | Wt 128.0 lb

## 2017-02-21 DIAGNOSIS — L299 Pruritus, unspecified: Secondary | ICD-10-CM

## 2017-02-21 DIAGNOSIS — Z3A33 33 weeks gestation of pregnancy: Secondary | ICD-10-CM

## 2017-02-21 DIAGNOSIS — Z3480 Encounter for supervision of other normal pregnancy, unspecified trimester: Secondary | ICD-10-CM

## 2017-02-21 NOTE — Progress Notes (Signed)
    Routine Prenatal Care Visit  Subjective  Cindy Lee is a 26 y.o. G3P1011 at [redacted]w[redacted]d being seen today for ongoing prenatal care.  She is currently monitored for the following issues for this low-risk pregnancy and has Supervision of other normal pregnancy, antepartum; History of gestational hypertension; and Pruritus on her problem list.  ----------------------------------------------------------------------------------- Patient reports pregnancy related itching.   Contractions: Not present. Vag. Bleeding: None.  Movement: Present. Denies leaking of fluid.  ----------------------------------------------------------------------------------- The following portions of the patient's history were reviewed and updated as appropriate: allergies, current medications, past family history, past medical history, past social history, past surgical history and problem list. Problem list updated.   Objective  Blood pressure (!) 98/58, weight 128 lb (58.1 kg), last menstrual period 07/02/2016, unknown if currently breastfeeding. Pregravid weight 97 lb (44 kg) Total Weight Gain 31 lb (14.1 kg) Urinalysis: Urine Protein: Negative Urine Glucose: Negative  Fetal Status: Fetal Heart Rate (bpm): 136 Fundal Height: 32 cm Movement: Present     General:  Alert, oriented and cooperative. Patient is in no acute distress.  Skin: Skin is warm and dry. No rash noted.   Cardiovascular: Normal heart rate noted  Respiratory: Normal respiratory effort, no problems with respiration noted  Abdomen: Soft, gravid, appropriate for gestational age. Pain/Pressure: Absent     Pelvic:  Cervical exam deferred        Extremities: Normal range of motion.     Mental Status: Normal mood and affect. Normal behavior. Normal judgment and thought content.     Assessment   26 y.o. G3P1011 at [redacted]w[redacted]d by  04/08/2017, by Last Menstrual Period presenting for routine prenatal visit.  Plan   Pregnancy #3 Problems (from 06/30/16 to  present)    No problems associated with this episode.    Still having trouble with itching. Benadryl not providing much relief. Bile acids wnl. Advised emollient cream such as Eucerin. Declines Tdap vaccine today.  Preterm labor symptoms and general obstetric precautions including but not limited to vaginal bleeding, contractions, leaking of fluid and fetal movement were reviewed in detail with the patient.   Return in about 2 weeks (around 03/07/2017), or ROB.  Marcelyn Bruins, CNM 02/21/2017  3:37 PM

## 2017-03-07 ENCOUNTER — Ambulatory Visit (INDEPENDENT_AMBULATORY_CARE_PROVIDER_SITE_OTHER): Payer: BLUE CROSS/BLUE SHIELD | Admitting: Obstetrics & Gynecology

## 2017-03-07 VITALS — BP 120/80 | Wt 134.0 lb

## 2017-03-07 DIAGNOSIS — Z98891 History of uterine scar from previous surgery: Secondary | ICD-10-CM | POA: Insufficient documentation

## 2017-03-07 DIAGNOSIS — Z3A35 35 weeks gestation of pregnancy: Secondary | ICD-10-CM

## 2017-03-07 DIAGNOSIS — Z3480 Encounter for supervision of other normal pregnancy, unspecified trimester: Secondary | ICD-10-CM

## 2017-03-07 DIAGNOSIS — Z8759 Personal history of other complications of pregnancy, childbirth and the puerperium: Secondary | ICD-10-CM

## 2017-03-07 NOTE — Patient Instructions (Signed)
Vaginal Birth After Cesarean Delivery Vaginal birth after cesarean delivery (VBAC) is giving birth vaginally after previously delivering a baby by a cesarean. In the past, if a woman had a cesarean delivery, all births afterward would be done by cesarean delivery. This is no longer true. It can be safe for the mother to try a vaginal delivery after having a cesarean delivery. It is important to discuss VBAC with your health care provider early in the pregnancy so you can understand the risks, benefits, and options. It will give you time to decide what is best in your particular case. The final decision about whether to have a VBAC or repeat cesarean delivery should be between you and your health care provider. Any changes in your health or your baby's health during your pregnancy may make it necessary to change your initial decision about VBAC. Women who plan to have a VBAC should check with their health care provider to be sure that:  The previous cesarean delivery was done with a low transverse uterine cut (incision) (not a vertical classical incision).  The birth canal is big enough for the baby.  There were no other operations on the uterus.  An electronic fetal monitor (EFM) will be on at all times during labor.  An operating room will be available and ready in case an emergency cesarean delivery is needed.  A health care provider and surgical nursing staff will be available at all times during labor to be ready to do an emergency delivery cesarean if necessary.  An anesthesiologist will be present in case an emergency cesarean delivery is needed.  The nursery is prepared and has adequate personnel and necessary equipment available to care for the baby in case of an emergency cesarean delivery. Benefits of VBAC  Shorter stay in the hospital.  Avoidance of risks associated with cesarean delivery, such as: ? Surgical complications, such as opening of the incision or hernia in the  incision. ? Injury to other organs. ? Fever. This can occur if an infection develops after surgery. It can also occur as a reaction to the medicine given to make you numb during the surgery.  Less blood loss and need for blood transfusions.  Lower risk of blood clots and infection.  Shorter recovery.  Decreased risk for having to remove the uterus (hysterectomy).  Decreased risk for the placenta to completely or partially cover the opening of the uterus (placenta previa) with a future pregnancy.  Decrease risk in future labor and delivery. Risks of a VBAC  Tearing (rupture) of the uterus. This is occurs in less than 1% of VBACs. The risk of this happening is higher if: ? Steps are taken to begin the labor process (induce labor) or stimulate or strengthen contractions (augment labor). ? Medicine is used to soften (ripen) the cervix.  Having to remove the uterus (hysterectomy) if it ruptures. VBAC should not be done if:  The previous cesarean delivery was done with a vertical (classical) or T-shaped incision or you do not know what kind of incision was made.  You had a ruptured uterus.  You have had certain types of surgery on your uterus, such as removal of uterine fibroids. Ask your health care provider about other types of surgeries that prevent you from having a VBAC.  You have certain medical or childbirth (obstetrical) problems.  There are problems with the baby.  You have had two previous cesarean deliveries and no vaginal deliveries. Other facts to know about VBAC:  It   is safe to have an epidural anesthetic with VBAC.  It is safe to turn the baby from a breech position (attempt an external cephalic version).  It is safe to try a VBAC with twins.  VBAC may not be successful if your baby weights 8.8 lb (4 kg) or more. However, weight predictions are not always accurate and should not be used alone to decide if VBAC is right for you.  There is an increased failure rate  if the time between the cesarean delivery and VBAC is less than 19 months.  Your health care provider may advise against a VBAC if you have preeclampsia (high blood pressure, protein in the urine, and swelling of face and extremities).  VBAC is often successful if you previously gave birth vaginally.  VBAC is often successful when the labor starts spontaneously before the due date.  Delivering a baby through a VBAC is similar to having a normal spontaneous vaginal delivery. This information is not intended to replace advice given to you by your health care provider. Make sure you discuss any questions you have with your health care provider. Document Released: 11/07/2006 Document Revised: 10/23/2015 Document Reviewed: 12/14/2012 Elsevier Interactive Patient Education  2018 Elsevier Inc.  

## 2017-03-07 NOTE — Progress Notes (Signed)
  Subjective  Fetal Movement? yes Contractions? no Leaking Fluid? no Vaginal Bleeding? no  Objective  BP 120/80   Wt 134 lb (60.8 kg)   LMP 07/02/2016 (Approximate)   BMI 23.00 kg/m  General: NAD Pumonary: no increased work of breathing Abdomen: gravid, non-tender Extremities: no edema Psychiatric: mood appropriate, affect full  Clinic WS Prenatal Labs  Dating [place] Blood type: B/Positive/-- (05/18 1142)   Genetic Screen 1 Screen:        NIPS: Antibody:Negative (05/18 1142)  Anatomic [place] WS Rubella: @  GTT Early:         Third trimester:  RPR: Non Reactive (09/10 1556)   Flu vaccine  HBsAg: Negative (05/18 1142)   TDaP vaccine          declined HIV:     Baby Food Breast                                              GBS: (For PCN allergy, check sensitivities)  Contraception Depo or pill Pap:  CS/VBAC CS   Cord Blood    Support Person        Assessment   26 y.o. [id number] at [redacted]w[redacted]d by  04/08/2017, by Last Menstrual Period presenting for routine prenatal visit  Plan   Problem List Items Addressed This Visit      Other   Supervision of other normal pregnancy, antepartum   History of gestational hypertension   History of cesarean delivery    Other Visit Diagnoses    [redacted] weeks gestation of pregnancy    -  Primary

## 2017-03-09 ENCOUNTER — Telehealth: Payer: Self-pay | Admitting: Obstetrics & Gynecology

## 2017-03-09 NOTE — Telephone Encounter (Signed)
-----   Message from Nadara Mustard, MD sent at 03/07/2017  4:08 PM EDT ----- Regarding: surg Surgery Booking Request Patient Full Name:   MRN: [id number]  DOB: 1990-11-06  Surgeon: Letitia Libra, MD  Requested Surgery Date and Time: 04/12/17 Primary Diagnosis AND Code: Repeat Cesarean, Term Pregnancy > 40 weeks Secondary Diagnosis and Code:  Surgical Procedure: Cesarean Section L&D Notification: Yes Admission Status: surgery admit Length of Surgery: 1 Special Case Needs: no H&P: yes (date) Phone Interview???: no Interpreter: Language:  Medical Clearance: no Special Scheduling Instructions: no

## 2017-03-09 NOTE — Telephone Encounter (Signed)
Patient is aware of H&P at [facility] on 04/08/17 @ 4:30pm w/ Dr. Tiburcio Pea, Pre-admit Testing on 04/11/17, and OR on 04/12/17.

## 2017-03-14 ENCOUNTER — Ambulatory Visit (INDEPENDENT_AMBULATORY_CARE_PROVIDER_SITE_OTHER): Payer: BLUE CROSS/BLUE SHIELD | Admitting: Obstetrics & Gynecology

## 2017-03-14 VITALS — BP 130/90 | Wt 138.0 lb

## 2017-03-14 DIAGNOSIS — Z98891 History of uterine scar from previous surgery: Secondary | ICD-10-CM

## 2017-03-14 DIAGNOSIS — Z3A36 36 weeks gestation of pregnancy: Secondary | ICD-10-CM

## 2017-03-14 DIAGNOSIS — Z8759 Personal history of other complications of pregnancy, childbirth and the puerperium: Secondary | ICD-10-CM

## 2017-03-14 DIAGNOSIS — Z3480 Encounter for supervision of other normal pregnancy, unspecified trimester: Secondary | ICD-10-CM

## 2017-03-14 NOTE — Progress Notes (Signed)
  Subjective  Fetal Movement? yes Contractions? yes Leaking Fluid? no Vaginal Bleeding? no  Objective  BP 130/90   Wt 138 lb (62.6 kg)   LMP 07/02/2016 (Approximate)   BMI 23.69 kg/m   BP 130/80 on recheck General: NAD Pumonary: no increased work of breathing Abdomen: gravid, non-tender Extremities: no edema CERVIX CL/40/-3 Psychiatric: mood appropriate, affect full  Clinic WS Prenatal Labs  Dating [place] Blood type: B/Positive/-- (05/18 1142)   Genetic Screen 1 Screen:        NIPS: Antibody:Negative (05/18 1142)  Anatomic [place] WS Rubella: 16.80 (05/18 1142)  GTT Early:         Third trimester: normal RPR: Non Reactive (09/10 1556)   Flu vaccine declined HBsAg: Negative (05/18 1142)   TDaP vaccine              declined HIV:     Baby Food                           Breast                    GBS: (For PCN allergy, check sensitivities)  Contraception [place] Pap:  CS/VBAC VBAC, CS Nov 13 if still pregnant   Cord Blood    Support Person     Assessment   26 y.o. [id number] at [redacted]w[redacted]d by  04/08/2017, by Last Menstrual Period presenting for routine prenatal visit  Plan   Problem List Items Addressed This Visit      Other   Supervision of other normal pregnancy, antepartum   History of gestational hypertension   History of cesarean delivery    Other Visit Diagnoses    [redacted] weeks gestation of pregnancy    -  Primary   Relevant Orders   Culture, beta strep (group b only)   GC/Chlamydia Probe Amp    Labor sx's discussed. GBS Monitor for s/sx preeclampsia.  BP checks.  Annamarie Major, MD, [facility], [facility] 03/14/2017  4:31 PM

## 2017-03-14 NOTE — Patient Instructions (Signed)
Group B Streptococcus Infection During Pregnancy Group B Streptococcus (GBS) is a type of bacteria (Streptococcus agalactiae) that is often found in healthy people, commonly in the rectum, vagina, and intestines. In people who are healthy and not pregnant, the bacteria rarely cause serious illness or complications. However, women who test positive for GBS during pregnancy can pass the bacteria to their baby during childbirth, which can cause serious infection in the baby after birth. Women with GBS may also have infections during their pregnancy or immediately after childbirth, such as such as urinary tract infections (UTIs) or infections of the uterus (uterine infections). Having GBS also increases a woman's risk of complications during pregnancy, such as early (preterm) labor or delivery, miscarriage, or stillbirth. Routine testing (screening) for GBS is recommended for all pregnant women. What increases the risk? You may have a higher risk for GBS infection during pregnancy if you had one during a past pregnancy. What are the signs or symptoms? In most cases, GBS infection does not cause symptoms in pregnant women. Signs and symptoms of a possible GBS-related infection may include:  Labor starting before the 37th week of pregnancy.  A UTI or bladder infection, which may cause: ? Fever. ? Pain or burning during urination. ? Frequent urination.  Fever during labor, along with: ? Bad-smelling discharge. ? Uterine tenderness. ? Rapid heartbeat in the mother, baby, or both.  Rare but serious symptoms of a possible GBS-related infection in women include:  Blood infection (septicemia). This may cause fever, chills, or confusion.  Lung infection (pneumonia). This may cause fever, chills, cough, rapid breathing, difficulty breathing, or chest pain.  Bone, joint, skin, or soft tissue infection.  How is this diagnosed? You may be screened for GBS between week 35 and week 37 of your pregnancy. If  you have symptoms of preterm labor, you may be screened earlier. This condition is diagnosed based on lab test results from:  A swab of fluid from the vagina and rectum.  A urine sample.  How is this treated? This condition is treated with antibiotic medicine. When you go into labor, or as soon as your water breaks (your membranes rupture), you will be given antibiotics through an IV tube. Antibiotics will continue until after you give birth. If you are having a cesarean delivery, you do not need antibiotics unless your membranes have already ruptured. Follow these instructions at home:  Take over-the-counter and prescription medicines only as told by your health care provider.  Take your antibiotic medicine as told by your health care provider. Do not stop taking the antibiotic even if you start to feel better.  Keep all pre-birth (prenatal) visits and follow-up visits as told by your health care provider. This is important. Contact a health care provider if:  You have pain or burning when you urinate.  You have to urinate frequently.  You have a fever or chills.  You develop a bad-smelling vaginal discharge. Get help right away if:  Your membranes rupture.  You go into labor.  You have severe pain in your abdomen.  You have difficulty breathing.  You have chest pain. This information is not intended to replace advice given to you by your health care provider. Make sure you discuss any questions you have with your health care provider. Document Released: 08/24/2007 Document Revised: 12/12/2015 Document Reviewed: 12/11/2015 Elsevier Interactive Patient Education  2018 Elsevier Inc.  

## 2017-03-18 LAB — CULTURE, BETA STREP (GROUP B ONLY): Strep Gp B Culture: NEGATIVE

## 2017-03-18 LAB — GC/CHLAMYDIA PROBE AMP
Chlamydia trachomatis, NAA: NEGATIVE
Neisseria gonorrhoeae by PCR: NEGATIVE

## 2017-03-21 ENCOUNTER — Observation Stay
Admission: EM | Admit: 2017-03-21 | Discharge: 2017-03-21 | Disposition: A | Discharge status: Home / Self Care | Payer: BLUE CROSS/BLUE SHIELD | Attending: Obstetrics and Gynecology | Admitting: Obstetrics and Gynecology

## 2017-03-21 ENCOUNTER — Ambulatory Visit (INDEPENDENT_AMBULATORY_CARE_PROVIDER_SITE_OTHER): Payer: BLUE CROSS/BLUE SHIELD | Admitting: Obstetrics and Gynecology

## 2017-03-21 VITALS — BP 134/90 | Wt 141.0 lb

## 2017-03-21 DIAGNOSIS — O26893 Other specified pregnancy related conditions, third trimester: Principal | ICD-10-CM | POA: Insufficient documentation

## 2017-03-21 DIAGNOSIS — O163 Unspecified maternal hypertension, third trimester: Secondary | ICD-10-CM

## 2017-03-21 DIAGNOSIS — Z3A37 37 weeks gestation of pregnancy: Secondary | ICD-10-CM

## 2017-03-21 DIAGNOSIS — Z3480 Encounter for supervision of other normal pregnancy, unspecified trimester: Secondary | ICD-10-CM

## 2017-03-21 DIAGNOSIS — Z87891 Personal history of nicotine dependence: Secondary | ICD-10-CM | POA: Diagnosis not present

## 2017-03-21 DIAGNOSIS — Z79899 Other long term (current) drug therapy: Secondary | ICD-10-CM | POA: Diagnosis not present

## 2017-03-21 DIAGNOSIS — R030 Elevated blood-pressure reading, without diagnosis of hypertension: Secondary | ICD-10-CM | POA: Insufficient documentation

## 2017-03-21 DIAGNOSIS — Z8759 Personal history of other complications of pregnancy, childbirth and the puerperium: Secondary | ICD-10-CM

## 2017-03-21 DIAGNOSIS — Z98891 History of uterine scar from previous surgery: Secondary | ICD-10-CM

## 2017-03-21 LAB — CBC
HCT: 31.4 % — ABNORMAL LOW (ref 35.0–47.0)
Hemoglobin: 10.1 g/dL — ABNORMAL LOW (ref 12.0–16.0)
MCH: 27 pg (ref 26.0–34.0)
MCHC: 32.1 g/dL (ref 32.0–36.0)
MCV: 84.2 fL (ref 80.0–100.0)
Platelets: 254 10*3/uL (ref 150–440)
RBC: 3.73 MIL/uL — ABNORMAL LOW (ref 3.80–5.20)
RDW: 14.3 % (ref 11.5–14.5)
WBC: 4.9 10*3/uL (ref 3.6–11.0)

## 2017-03-21 LAB — COMPREHENSIVE METABOLIC PANEL
ALT: 16 U/L (ref 14–54)
AST: 25 U/L (ref 15–41)
Albumin: 2.8 g/dL — ABNORMAL LOW (ref 3.5–5.0)
Alkaline Phosphatase: 177 U/L — ABNORMAL HIGH (ref 38–126)
Anion gap: 11 (ref 5–15)
BUN: 7 mg/dL (ref 6–20)
CO2: 20 mmol/L — ABNORMAL LOW (ref 22–32)
Calcium: 9.2 mg/dL (ref 8.9–10.3)
Chloride: 107 mmol/L (ref 101–111)
Creatinine, Ser: 0.76 mg/dL (ref 0.44–1.00)
GFR calc Af Amer: >60 mL/min (ref >60)
GFR calc non Af Amer: >60 mL/min (ref >60)
Glucose, Bld: 82 mg/dL (ref 65–99)
Potassium: 3.7 mmol/L (ref 3.5–5.1)
Sodium: 138 mmol/L (ref 135–145)
Total Bilirubin: 0.6 mg/dL (ref 0.3–1.2)
Total Protein: 6.8 g/dL (ref 6.5–8.1)

## 2017-03-21 LAB — PROTEIN / CREATININE RATIO, URINE
Creatinine, Urine: 55 mg/dL
Protein Creatinine Ratio: 0.29 mg/mg{Cre} — ABNORMAL HIGH (ref 0.00–0.15)
Total Protein, Urine: 16 mg/dL

## 2017-03-21 MED ORDER — ACETAMINOPHEN 325 MG PO TABS: 650.0000 mg | ORAL_TABLET | ORAL | Status: DC | PRN

## 2017-03-21 NOTE — Progress Notes (Signed)
Routine Prenatal Care Visit  Subjective  Cindy Lee is a 26 y.o. G3P1011 at 694w3d being seen today for ongoing prenatal care.  She is currently monitored for the following issues for this high-risk pregnancy and has Supervision of other normal pregnancy, antepartum; History of gestational hypertension; Pruritus; History of cesarean delivery; and Elevated blood pressure affecting pregnancy in third trimester, antepartum on her problem list.  ----------------------------------------------------------------------------------- Patient reports no complaints.  Denies HA, visual changes, and RUQ pain. Contractions: Irregular. Vag. Bleeding: None.  Movement: Present. Denies leaking of fluid.  ----------------------------------------------------------------------------------- The following portions of the patient's history were reviewed and updated as appropriate: allergies, current medications, past family history, past medical history, past social history, past surgical history and problem list. Problem list updated.  Objective  Blood pressure 134/90, weight 141 lb (64 kg), last menstrual period 07/02/2016, unknown if currently breastfeeding. Repeat blood pressure 128/90 Pregravid weight 97 lb (44 kg) Total Weight Gain 44 lb (20 kg) Urinalysis: Urine Protein: Negative Urine Glucose: Negative  Fetal Status: Fetal Heart Rate (bpm): 145 Fundal Height: 36 cm Movement: Present  Presentation: Vertex  General:  Alert, oriented and cooperative. Patient is in no acute distress.  Skin: Skin is warm and dry. No rash noted.   Cardiovascular: Normal heart rate noted  Respiratory: Normal respiratory effort, no problems with respiration noted  Abdomen: Soft, gravid, appropriate for gestational age. Pain/Pressure: Absent     Pelvic:  Cervical exam deferred        Extremities: Normal range of motion.     Mental Status: Normal mood and affect. Normal behavior. Normal judgment and thought content.    Assessment   26 y.o. G3P1011 at 264w3d by  04/08/2017, by Last Menstrual Period presenting for routine prenatal visit  Plan   Pregnancy #3 Problems (from 06/30/16 to present)    Problem Noted Resolved   Elevated blood pressure affecting pregnancy in third trimester, antepartum 03/21/2017 by Conard NovakJackson, Lyza Houseworth D, MD No   History of cesarean delivery 03/07/2017 by Nadara MustardHarris, Robert P, MD No   Supervision of other normal pregnancy, antepartum 10/15/2016 by Tresea MallGledhill, Jane, CNM No   Overview Signed 11/24/2016  5:01 PM by Vena AustriaStaebler, Andreas, MD     [facility]atal Labs  Dating  Blood type: B/Positive/-- (05/18 1142)   Genetic Screen 1 Screen:    AFP:     Quad:     NIPS: Antibody:Negative (05/18 1142)  Anatomic US 11/24/16 Rubella: 16.80 (05/18 1142)  GTT Early:               Third trimester:  RPR: Non Reactive (05/18 1142)   Flu vaccine  HBsAg: Negative (05/18 1142)   TDaP vaccine                                               Rhogam: HIV: Non Reactive (05/18 1142)   Baby Food                                               GBS: (For PCN allergy, check sensitivities)  Contraception  Pap:  Circumcision    Pediatrician    Support Person         History of gestational hypertension 10/15/2016 by Tresea MallGledhill, Jane, CNM  No    ELEVATED BP: To L&D for further evaluation.    Term labor symptoms and general obstetric precautions including but not limited to vaginal bleeding, contractions, leaking of fluid and fetal movement were reviewed in detail with the patient. Please refer to After Visit Summary for other counseling recommendations.   Return in about 1 week (around 03/28/2017) for Routine Prenatal Appointment.  Thomasene Mohair, MD  03/21/2017 4:29 PM

## 2017-03-21 NOTE — Final Progress Note (Addendum)
Physician Final Progress Note  Patient ID: Cindy Lee MRN: [id number] DOB/AGE: Jun 22, 1990 25 y.o.  Admit date: 03/21/2017 Admitting provider: Conard NovakStephen D Jesstin Studstill, MD Discharge date: 03/21/2017   Admission Diagnoses: Elevated blood pressure affecting pregnancy  Discharge Diagnoses:  None  History of Present Illness: The patient is a 26 y.o. female G3P1011 at 6953w3d who presents for two elevated blood pressures in clinic (134/90 and 128/90). Denies headache, visual changes, epigastric and RUQ pain. No vaginal bleeding, loss of fluid. Occasional contractions, not painful. Good fetal movement.  10 point review of systems negative unless otherwise noted in HPI.  History reviewed. No pertinent past medical history.  Past Surgical History:  Procedure Laterality Date  . CESAREAN SECTION N/A 09/12/2015   Procedure: CESAREAN SECTION;  Surgeon: Conard NovakStephen D Genelda Roark, MD;  Location: [facility] ORS;  Service: Obstetrics;  Laterality: N/A;    No current facility-administered medications on file prior to encounter.    Current Outpatient Prescriptions on File Prior to Encounter  Medication Sig Dispense Refill  . Prenatal Vit-Fe Fumarate-FA (MULTIVITAMIN-PRENATAL) 27-0.8 MG TABS tablet Take 1 tablet by mouth daily at 12 noon.    . diphenhydrAMINE (BENADRYL) 25 mg capsule Take 1 capsule (25 mg total) by mouth every 4 (four) hours as needed for itching. (Patient not taking: Reported on 03/21/2017) 30 capsule 2    No Known Allergies  Social History   Social History  . Marital status: Single    Spouse name: N/A  . Number of children: N/A  . Years of education: N/A   Occupational History  . Not on file.   Social History Main Topics  . Smoking status: Former Smoker    Types: Cigarettes    Quit date: 10/05/2016  . Smokeless tobacco: Never Used  . Alcohol use No  . Drug use: No  . Sexual activity: Yes    Birth control/ protection: None   Other Topics Concern  . Not on file   Social  History Narrative  . No narrative on file    Physical Exam: BP 133/90   Pulse 89   Temp 97.6 F (36.4 C) (Oral)   Resp 18   Ht 5\' 4"  (1.626 m)   Wt 141 lb (64 kg)   LMP 07/02/2016 (Approximate)   BMI 24.20 kg/m   Gen: NAD CV: RRR Pulm: CTAB Pelvic: deferred Ext: no edema, no erythema, no tenderness  Consults: None  Significant Findings/ Diagnostic Studies: labs: CBC, CMP values within normal limits. P/C ratio 0.29. Blood pressures have decreased to 120s over 70s to 80s during the past hour.  Procedures: NST Baseline:  Variability: moderate Accelerations: present Decelerations: absent Tocometry: occasional contraction The patient was monitored for 30+ minutes, fetal heart rate tracing was deemed reactive, category I tracing,  Discharge Condition: stable  Disposition: 01-Home or Self Care, return to clinic for BP check in 3 days  Diet: Regular diet  Discharge Activity: Activity as tolerated  Discharge Instructions    Discharge activity:  No Restrictions    Complete by:  As directed    Discharge diet:  No restrictions    Complete by:  As directed    Fetal Kick Count:  Lie on our left side for one hour after a meal, and count the number of times your baby kicks.  If it is less than 5 times, get up, move around and drink some juice.  Repeat the test 30 minutes later.  If it is still less than 5 kicks in an hour, notify your  doctor.    Complete by:  As directed    LABOR:  When conractions begin, you should start to time them from the beginning of one contraction to the beginning  of the next.  When contractions are 5 - 10 minutes apart or less and have been regular for at least an hour, you should call your health care provider.    Complete by:  As directed    No sexual activity restrictions    Complete by:  As directed    Notify physician for bleeding from the vagina    Complete by:  As directed    Notify physician for blurring of vision or spots before the eyes     Complete by:  As directed    Notify physician for chills or fever    Complete by:  As directed    Notify physician for fainting spells, "black outs" or loss of consciousness    Complete by:  As directed    Notify physician for increase in vaginal discharge    Complete by:  As directed    Notify physician for leaking of fluid    Complete by:  As directed    Notify physician for pain or burning when urinating    Complete by:  As directed    Notify physician for pelvic pressure (sudden increase)    Complete by:  As directed    Notify physician for severe or continued nausea or vomiting    Complete by:  As directed    Notify physician for sudden gushing of fluid from the vagina (with or without continued leaking)    Complete by:  As directed    Notify physician for sudden, constant, or occasional abdominal pain    Complete by:  As directed    Notify physician if baby moving less than usual    Complete by:  As directed      Allergies as of 03/21/2017   No Known Allergies     Medication List    TAKE these medications   diphenhydrAMINE 25 mg capsule Commonly known as:  BENADRYL Take 1 capsule (25 mg total) by mouth every 4 (four) hours as needed for itching.   multivitamin-prenatal 27-0.8 MG Tabs tablet Take 1 tablet by mouth daily at 12 noon.      Follow-up Information    Conard Novak, MD. Schedule an appointment as soon as possible for a visit in 3 day(s).   Specialty:  Obstetrics and Gynecology Contact information: [address] [place] [address] [phone]          Total time spent taking care of this patient: 15 minutes  Signed: Oswaldo Conroy, CNM  03/21/2017, 7:30 PM   Physician Attestation: Patient presented from office after having two elevated blood pressures. She had no symptoms of HA, visual changes,and RUQ pain.  She had an elevated blood pressure upon presentation to L&D and her BPs were in the normal range thereafter.  Her labs  did not indicated severe features or HELLP syndrome. Her urine protein/creatinine did not meet criteria for significant proteinuria.  She is discharged as she did not meet criteria for gestational hypertension.  She is instructed to have close follow up for BP follow up. She was given strict precautions regarding the development of a headache, visual changes, RUQ pain, nausea, or any other concerning symptom.   Thomasene Mohair, MD 03/22/2017 3:39 AM

## 2017-03-21 NOTE — Discharge Summary (Signed)
See Final Progress Note 03/21/17.  Marcelyn BruinsJacelyn Garnell Phenix, CNM 03/21/2017  8:04 PM

## 2017-03-21 NOTE — OB Triage Note (Signed)
G3/P2, 1969w3d sent from [facility] due to elevated blood pressure. BP on admission 131/90. Denies pain. Positive fetal movement. Denies vaginal discharge/bleeding. Reports contractions "a few per hour," rated 2/10, discomfort. Monitors applied and assessing.

## 2017-03-23 ENCOUNTER — Observation Stay
Admission: EM | Admit: 2017-03-23 | Discharge: 2017-03-23 | Disposition: A | Discharge status: Home / Self Care | Payer: BLUE CROSS/BLUE SHIELD | Attending: Obstetrics and Gynecology | Admitting: Obstetrics and Gynecology

## 2017-03-23 DIAGNOSIS — O26893 Other specified pregnancy related conditions, third trimester: Secondary | ICD-10-CM | POA: Diagnosis not present

## 2017-03-23 DIAGNOSIS — Z3A37 37 weeks gestation of pregnancy: Secondary | ICD-10-CM | POA: Insufficient documentation

## 2017-03-23 DIAGNOSIS — Z8759 Personal history of other complications of pregnancy, childbirth and the puerperium: Secondary | ICD-10-CM

## 2017-03-23 DIAGNOSIS — R51 Headache: Secondary | ICD-10-CM | POA: Insufficient documentation

## 2017-03-23 DIAGNOSIS — O163 Unspecified maternal hypertension, third trimester: Secondary | ICD-10-CM

## 2017-03-23 DIAGNOSIS — O2690 Pregnancy related conditions, unspecified, unspecified trimester: Secondary | ICD-10-CM | POA: Diagnosis present

## 2017-03-23 DIAGNOSIS — Z3480 Encounter for supervision of other normal pregnancy, unspecified trimester: Secondary | ICD-10-CM

## 2017-03-23 DIAGNOSIS — Z98891 History of uterine scar from previous surgery: Secondary | ICD-10-CM

## 2017-03-23 NOTE — Discharge Summary (Signed)
See final progress note. 

## 2017-03-23 NOTE — OB Triage Note (Signed)
Pt is a 26 y/o G3 P1 that presents from ED c/o headache since 1500 03/23/17 5/10 on pain scale. Pt c/o leaking clear fluid since 1230 03/23/17 and ctx every 20-30 minutes. Pt denies VB and states positive FM.

## 2017-03-23 NOTE — Final Progress Note (Signed)
Physician Final Progress Note  Patient ID: Cindy Lee MRN: [id number] DOB/AGE: [birth year] 26 y.o.  Admit date: 03/23/2017 Admitting provider: Vena Austria, MD Discharge date: 03/23/2017   Admission Diagnoses: Headache, contractions  Discharge Diagnoses:  Active Problems:   Labor and delivery indication for care or intervention  26 yo G3P1011 at [redacted]w[redacted]d with history of GHTN presenting for contractions and headaches.  Irregular contractions, cervix high and posterior, normotensive on admission.   Discussed labor symptoms and that these likely represent braxton hick contractions  Consults: None  Significant Findings/ Diagnostic Studies: none  Procedures:  Baseline: 140 Variability: moderate Accelerations: present Decelerations: absent Tocometry: q45min The patient was monitored for 30 minutes, fetal heart rate tracing was deemed reactive, category I tracing,  Discharge Condition: good  Disposition: 01-Home or Self Care  Diet: Regular diet  Discharge Activity: Activity as tolerated  Discharge Instructions    Discharge activity:  No Restrictions    Complete by:  As directed    Discharge diet:  No restrictions    Complete by:  As directed    Fetal Kick Count:  Lie on our left side for one hour after a meal, and count the number of times your baby kicks.  If it is less than 5 times, get up, move around and drink some juice.  Repeat the test 30 minutes later.  If it is still less than 5 kicks in an hour, notify your doctor.    Complete by:  As directed    LABOR:  When conractions begin, you should start to time them from the beginning of one contraction to the beginning  of the next.  When contractions are 5 - 10 minutes apart or less and have been regular for at least an hour, you should call your health care provider.    Complete by:  As directed    No sexual activity restrictions    Complete by:  As directed    Notify physician for bleeding from the vagina     Complete by:  As directed    Notify physician for blurring of vision or spots before the eyes    Complete by:  As directed    Notify physician for chills or fever    Complete by:  As directed    Notify physician for fainting spells, "black outs" or loss of consciousness    Complete by:  As directed    Notify physician for increase in vaginal discharge    Complete by:  As directed    Notify physician for leaking of fluid    Complete by:  As directed    Notify physician for pain or burning when urinating    Complete by:  As directed    Notify physician for pelvic pressure (sudden increase)    Complete by:  As directed    Notify physician for severe or continued nausea or vomiting    Complete by:  As directed    Notify physician for sudden gushing of fluid from the vagina (with or without continued leaking)    Complete by:  As directed    Notify physician for sudden, constant, or occasional abdominal pain    Complete by:  As directed    Notify physician if baby moving less than usual    Complete by:  As directed      Allergies as of 03/23/2017   No Known Allergies     Medication List    TAKE these medications   diphenhydrAMINE 25 mg  capsule Commonly known as:  BENADRYL Take 1 capsule (25 mg total) by mouth every 4 (four) hours as needed for itching.   multivitamin-prenatal 27-0.8 MG Tabs tablet Take 1 tablet by mouth daily at 12 noon.        Total time spent taking care of this patient: 20 minutes  Signed: Vena Austriandreas Diaz Crago 03/23/2017, 4:59 PM

## 2017-03-24 ENCOUNTER — Ambulatory Visit (INDEPENDENT_AMBULATORY_CARE_PROVIDER_SITE_OTHER): Payer: BLUE CROSS/BLUE SHIELD | Admitting: Obstetrics & Gynecology

## 2017-03-24 VITALS — BP 120/70 | Wt 140.0 lb

## 2017-03-24 DIAGNOSIS — Z98891 History of uterine scar from previous surgery: Secondary | ICD-10-CM

## 2017-03-24 DIAGNOSIS — Z3480 Encounter for supervision of other normal pregnancy, unspecified trimester: Secondary | ICD-10-CM

## 2017-03-24 DIAGNOSIS — Z3A37 37 weeks gestation of pregnancy: Secondary | ICD-10-CM

## 2017-03-24 NOTE — Progress Notes (Signed)
Occas ctx.  Headache yesterday, improved.  Swelling is intermittant, none today.  Denies visual changes, CP, SOB, epigastric pain. BP normal today. SVE unchanged. FHT 140s A/P: 37 weeks.  Desires TOLAC.  Monitor for s/sx labor Modified rest due to BPs up and down.

## 2017-03-28 ENCOUNTER — Ambulatory Visit (INDEPENDENT_AMBULATORY_CARE_PROVIDER_SITE_OTHER): Payer: BLUE CROSS/BLUE SHIELD | Admitting: Advanced Practice Midwife

## 2017-03-28 VITALS — BP 136/78 | Wt 142.0 lb

## 2017-03-28 DIAGNOSIS — Z3A38 38 weeks gestation of pregnancy: Secondary | ICD-10-CM

## 2017-03-28 NOTE — Progress Notes (Signed)
ROB Headache B/P recheck..128/72 KJ CMA

## 2017-03-28 NOTE — Patient Instructions (Signed)
Vaginal Birth After Cesarean Delivery Vaginal birth after cesarean delivery (VBAC) is giving birth vaginally after previously delivering a baby by a cesarean. In the past, if a woman had a cesarean delivery, all births afterward would be done by cesarean delivery. This is no longer true. It can be safe for the mother to try a vaginal delivery after having a cesarean delivery. It is important to discuss VBAC with your health care provider early in the pregnancy so you can understand the risks, benefits, and options. It will give you time to decide what is best in your particular case. The final decision about whether to have a VBAC or repeat cesarean delivery should be between you and your health care provider. Any changes in your health or your baby's health during your pregnancy may make it necessary to change your initial decision about VBAC. Women who plan to have a VBAC should check with their health care provider to be sure that:  The previous cesarean delivery was done with a low transverse uterine cut (incision) (not a vertical classical incision).  The birth canal is big enough for the baby.  There were no other operations on the uterus.  An electronic fetal monitor (EFM) will be on at all times during labor.  An operating room will be available and ready in case an emergency cesarean delivery is needed.  A health care provider and surgical nursing staff will be available at all times during labor to be ready to do an emergency delivery cesarean if necessary.  An anesthesiologist will be present in case an emergency cesarean delivery is needed.  The nursery is prepared and has adequate personnel and necessary equipment available to care for the baby in case of an emergency cesarean delivery. Benefits of VBAC  Shorter stay in the hospital.  Avoidance of risks associated with cesarean delivery, such as: ? Surgical complications, such as opening of the incision or hernia in the  incision. ? Injury to other organs. ? Fever. This can occur if an infection develops after surgery. It can also occur as a reaction to the medicine given to make you numb during the surgery.  Less blood loss and need for blood transfusions.  Lower risk of blood clots and infection.  Shorter recovery.  Decreased risk for having to remove the uterus (hysterectomy).  Decreased risk for the placenta to completely or partially cover the opening of the uterus (placenta previa) with a future pregnancy.  Decrease risk in future labor and delivery. Risks of a VBAC  Tearing (rupture) of the uterus. This is occurs in less than 1% of VBACs. The risk of this happening is higher if: ? Steps are taken to begin the labor process (induce labor) or stimulate or strengthen contractions (augment labor). ? Medicine is used to soften (ripen) the cervix.  Having to remove the uterus (hysterectomy) if it ruptures. VBAC should not be done if:  The previous cesarean delivery was done with a vertical (classical) or T-shaped incision or you do not know what kind of incision was made.  You had a ruptured uterus.  You have had certain types of surgery on your uterus, such as removal of uterine fibroids. Ask your health care provider about other types of surgeries that prevent you from having a VBAC.  You have certain medical or childbirth (obstetrical) problems.  There are problems with the baby.  You have had two previous cesarean deliveries and no vaginal deliveries. Other facts to know about VBAC:  It   is safe to have an epidural anesthetic with VBAC.  It is safe to turn the baby from a breech position (attempt an external cephalic version).  It is safe to try a VBAC with twins.  VBAC may not be successful if your baby weights 8.8 lb (4 kg) or more. However, weight predictions are not always accurate and should not be used alone to decide if VBAC is right for you.  There is an increased failure rate  if the time between the cesarean delivery and VBAC is less than 19 months.  Your health care provider may advise against a VBAC if you have preeclampsia (high blood pressure, protein in the urine, and swelling of face and extremities).  VBAC is often successful if you previously gave birth vaginally.  VBAC is often successful when the labor starts spontaneously before the due date.  Delivering a baby through a VBAC is similar to having a normal spontaneous vaginal delivery. This information is not intended to replace advice given to you by your health care provider. Make sure you discuss any questions you have with your health care provider. Document Released: 11/07/2006 Document Revised: 10/23/2015 Document Reviewed: 12/14/2012 Elsevier Interactive Patient Education  2018 Elsevier Inc.  

## 2017-03-28 NOTE — Progress Notes (Signed)
Routine Prenatal Care Visit  Subjective  Cindy Lee is a 26 y.o. G3P1011 at [redacted]w[redacted]d being seen today for ongoing prenatal care.  She is currently monitored for the following issues for this high-risk pregnancy and has Supervision of other normal pregnancy, antepartum; History of gestational hypertension; Pruritus; History of cesarean delivery; Elevated blood pressure affecting pregnancy in third trimester, antepartum; and Labor and delivery indication for care or intervention on her problem list.  ----------------------------------------------------------------------------------- Patient reports contractions every 15 minutes since 5 am this morning. She has also had diarrhea since 5 am. She has had a headache since 9 pm last night and when she moves from lying to sitting she sees floaters. She took 1 tylenol that did not relieve her headache. She has been mostly resting since she has had the headache.   Denies leaking of fluid. Denies vaginal bleeding. ----------------------------------------------------------------------------------- The following portions of the patient's history were reviewed and updated as appropriate: allergies, current medications, past family history, past medical history, past social history, past surgical history and problem list. Problem list updated.   Objective  Blood pressure 136/78, weight 142 lb (64.4 kg), last menstrual period 07/02/2016, recheck of headache is 128/72. Pregravid weight 97 lb (44 kg) Total Weight Gain 45 lb (20.4 kg) Urinalysis: Urine Protein: 1+ Urine Glucose: Negative  Fetal Status: positive fetal movement  General:   Alert, oriented and cooperative. Mild distress from headache.  Skin: Skin is warm and dry. No rash noted.   Cardiovascular: Normal heart rate noted  Respiratory: Normal respiratory effort, no problems with respiration noted  Abdomen: Soft, gravid, appropriate for gestational age.       Pelvic:  Cervical exam performed  Cervix is posterior and I am unable to determine dilation, but station is 1+  Extremities: Normal range of motion.     Mental Status: Normal mood and affect. Normal behavior. Normal judgment and thought content.   Assessment   26 y.o. G3P1011 at [redacted]w[redacted]d by  04/08/2017, by Last Menstrual Period presenting for routine prenatal visit  Plan   Pregnancy #3 Problems (from 06/30/16 to present)    Problem Noted Resolved   Elevated blood pressure affecting pregnancy in third trimester, antepartum 03/21/2017 by Conard Novak, MD No   History of cesarean delivery 03/07/2017 by Nadara Mustard, MD No   Supervision of other normal pregnancy, antepartum 10/15/2016 by Tresea Mall, CNM No   Overview Signed 11/24/2016  5:01 PM by Vena Austria, MD     Clinic [facility] Prenatal Labs  Dating  Blood type: B/Positive/-- (05/18 1142)   Genetic Screen 1 Screen:    AFP:     Quad:     NIPS: Antibody:Negative (05/18 1142)  Anatomic [place] 11/24/16 Rubella: 16.80 (05/18 1142)  GTT Early:               Third trimester:  RPR: Non Reactive (05/18 1142)   Flu vaccine  HBsAg: Negative (05/18 1142)   TDaP vaccine                                               Rhogam: HIV: Non Reactive (05/18 1142)   Baby Food  GBS: (For PCN allergy, check sensitivities)  Contraception  Pap:  Circumcision    Pediatrician    Support Person              History of gestational hypertension 10/15/2016 by Tresea MallGledhill, [name], CNM No       Term labor symptoms and general obstetric precautions including but not limited to vaginal bleeding, contractions, leaking of fluid and fetal movement, worsening headache/visual changes were reviewed in detail with the patient. Please refer to After Visit Summary for other counseling recommendations.   Return in about 1 week (around 04/04/2017) for rob.  Tresea MallJane Zadin Lange, CNM  03/28/2017 3:40 PM

## 2017-03-29 ENCOUNTER — Telehealth: Payer: Self-pay

## 2017-03-29 NOTE — Telephone Encounter (Signed)
FMLA/DISABILITY form for [employer] filled out and given to [place] for processing. 

## 2017-04-04 ENCOUNTER — Encounter: Payer: BLUE CROSS/BLUE SHIELD | Admitting: Obstetrics and Gynecology

## 2017-04-04 ENCOUNTER — Encounter: Payer: Self-pay | Admitting: Certified Nurse Midwife

## 2017-04-04 ENCOUNTER — Inpatient Hospital Stay: Payer: BLUE CROSS/BLUE SHIELD | Admitting: Anesthesiology

## 2017-04-04 ENCOUNTER — Ambulatory Visit (INDEPENDENT_AMBULATORY_CARE_PROVIDER_SITE_OTHER): Payer: BLUE CROSS/BLUE SHIELD | Admitting: Obstetrics and Gynecology

## 2017-04-04 ENCOUNTER — Inpatient Hospital Stay
Admission: EM | Admit: 2017-04-04 | Discharge: 2017-04-06 | DRG: 805 | Disposition: A | Discharge status: Home / Self Care | Payer: BLUE CROSS/BLUE SHIELD | Attending: Certified Nurse Midwife | Admitting: Certified Nurse Midwife

## 2017-04-04 ENCOUNTER — Encounter: Payer: Self-pay | Admitting: Obstetrics and Gynecology

## 2017-04-04 VITALS — BP 120/70 | Wt 142.0 lb

## 2017-04-04 DIAGNOSIS — O1404 Mild to moderate pre-eclampsia, complicating childbirth: Secondary | ICD-10-CM | POA: Diagnosis present

## 2017-04-04 DIAGNOSIS — O163 Unspecified maternal hypertension, third trimester: Secondary | ICD-10-CM | POA: Diagnosis present

## 2017-04-04 DIAGNOSIS — O34219 Maternal care for unspecified type scar from previous cesarean delivery: Secondary | ICD-10-CM | POA: Diagnosis not present

## 2017-04-04 DIAGNOSIS — Z3A39 39 weeks gestation of pregnancy: Secondary | ICD-10-CM | POA: Diagnosis not present

## 2017-04-04 DIAGNOSIS — Z3480 Encounter for supervision of other normal pregnancy, unspecified trimester: Secondary | ICD-10-CM

## 2017-04-04 DIAGNOSIS — Z8759 Personal history of other complications of pregnancy, childbirth and the puerperium: Secondary | ICD-10-CM | POA: Diagnosis present

## 2017-04-04 DIAGNOSIS — Z87891 Personal history of nicotine dependence: Secondary | ICD-10-CM

## 2017-04-04 DIAGNOSIS — O411230 Chorioamnionitis, third trimester, not applicable or unspecified: Secondary | ICD-10-CM | POA: Diagnosis present

## 2017-04-04 DIAGNOSIS — O34211 Maternal care for low transverse scar from previous cesarean delivery: Principal | ICD-10-CM | POA: Diagnosis present

## 2017-04-04 DIAGNOSIS — Z98891 History of uterine scar from previous surgery: Secondary | ICD-10-CM

## 2017-04-04 DIAGNOSIS — O09299 Supervision of pregnancy with other poor reproductive or obstetric history, unspecified trimester: Secondary | ICD-10-CM

## 2017-04-04 HISTORY — DX: History of uterine scar from previous surgery: Z98.891

## 2017-04-04 LAB — COMPREHENSIVE METABOLIC PANEL
ALT: 17 U/L (ref 14–54)
AST: 35 U/L (ref 15–41)
Albumin: 2.9 g/dL — ABNORMAL LOW (ref 3.5–5.0)
Alkaline Phosphatase: 184 U/L — ABNORMAL HIGH (ref 38–126)
Anion gap: 9 (ref 5–15)
BUN: 9 mg/dL (ref 6–20)
CO2: 20 mmol/L — ABNORMAL LOW (ref 22–32)
Calcium: 8.7 mg/dL — ABNORMAL LOW (ref 8.9–10.3)
Chloride: 105 mmol/L (ref 101–111)
Creatinine, Ser: 0.61 mg/dL (ref 0.44–1.00)
GFR calc Af Amer: >60 mL/min (ref >60)
GFR calc non Af Amer: >60 mL/min (ref >60)
Glucose, Bld: 130 mg/dL — ABNORMAL HIGH (ref 65–99)
Potassium: 3.6 mmol/L (ref 3.5–5.1)
Sodium: 134 mmol/L — ABNORMAL LOW (ref 135–145)
Total Bilirubin: 0.8 mg/dL (ref 0.3–1.2)
Total Protein: 6.7 g/dL (ref 6.5–8.1)

## 2017-04-04 LAB — CBC
HCT: 32.7 % — ABNORMAL LOW (ref 35.0–47.0)
Hemoglobin: 10.8 g/dL — ABNORMAL LOW (ref 12.0–16.0)
MCH: 27.7 pg (ref 26.0–34.0)
MCHC: 33.1 g/dL (ref 32.0–36.0)
MCV: 83.7 fL (ref 80.0–100.0)
Platelets: 250 10*3/uL (ref 150–440)
RBC: 3.91 MIL/uL (ref 3.80–5.20)
RDW: 15.9 % — ABNORMAL HIGH (ref 11.5–14.5)
WBC: 3.9 10*3/uL (ref 3.6–11.0)

## 2017-04-04 LAB — TYPE AND SCREEN
ABO/RH(D): B POS
Antibody Screen: NEGATIVE

## 2017-04-04 LAB — PROTEIN / CREATININE RATIO, URINE
Creatinine, Urine: 146 mg/dL
Protein Creatinine Ratio: 0.81 mg/mg{Cre} — ABNORMAL HIGH (ref 0.00–0.15)
Total Protein, Urine: 118 mg/dL

## 2017-04-04 MED ORDER — SOD CITRATE-CITRIC ACID 500-334 MG/5ML PO SOLN
ORAL | Status: AC
  Filled 2017-04-04: qty 15

## 2017-04-04 MED ORDER — OXYTOCIN 10 UNIT/ML IJ SOLN
INTRAMUSCULAR | Status: AC
  Filled 2017-04-04: qty 2

## 2017-04-04 MED ORDER — LACTATED RINGERS IV SOLN: 500.0000 mL | Freq: Once | INTRAVENOUS | Status: DC

## 2017-04-04 MED ORDER — CEFOXITIN SODIUM-DEXTROSE 2-2.2 GM-%(50ML) IV SOLR
INTRAVENOUS | Status: AC
  Filled 2017-04-04: qty 50

## 2017-04-04 MED ORDER — ONDANSETRON HCL 4 MG/2ML IJ SOLN: 4.0000 mg | Freq: Four times a day (QID) | INTRAMUSCULAR | Status: DC | PRN

## 2017-04-04 MED ORDER — OXYTOCIN BOLUS FROM INFUSION
500.0000 mL | Freq: Once | INTRAVENOUS | Status: AC
  Administered 2017-04-05: 500 mL via INTRAVENOUS

## 2017-04-04 MED ORDER — LIDOCAINE-EPINEPHRINE (PF) 1.5 %-1:200000 IJ SOLN
INTRAMUSCULAR | Status: DC | PRN
  Administered 2017-04-04: 3 mL via EPIDURAL

## 2017-04-04 MED ORDER — FENTANYL 2.5 MCG/ML W/ROPIVACAINE 0.15% IN NS 100 ML EPIDURAL (ARMC)
EPIDURAL | Status: AC
  Administered 2017-04-04: 250 ug
  Filled 2017-04-04: qty 100

## 2017-04-04 MED ORDER — EPHEDRINE 5 MG/ML INJ
10.0000 mg | INTRAVENOUS | Status: DC | PRN
  Filled 2017-04-04: qty 2

## 2017-04-04 MED ORDER — PHENYLEPHRINE 40 MCG/ML (10ML) SYRINGE FOR IV PUSH (FOR BLOOD PRESSURE SUPPORT)
80.0000 ug | PREFILLED_SYRINGE | INTRAVENOUS | Status: DC | PRN
  Filled 2017-04-04: qty 5

## 2017-04-04 MED ORDER — LIDOCAINE HCL (PF) 1 % IJ SOLN
30.0000 mL | INTRAMUSCULAR | Status: AC | PRN
  Administered 2017-04-04: 1.2 mL via SUBCUTANEOUS

## 2017-04-04 MED ORDER — LIDOCAINE HCL (PF) 1 % IJ SOLN
INTRAMUSCULAR | Status: AC
  Filled 2017-04-04: qty 30

## 2017-04-04 MED ORDER — FENTANYL 2.5 MCG/ML W/ROPIVACAINE 0.15% IN NS 100 ML EPIDURAL (ARMC)
EPIDURAL | Status: DC | PRN
  Administered 2017-04-04: 12 mL/h via EPIDURAL
  Administered 2017-04-05: 250 ug via EPIDURAL

## 2017-04-04 MED ORDER — LACTATED RINGERS IV SOLN
500.0000 mL | INTRAVENOUS | Status: DC | PRN
  Administered 2017-04-04: 250 mL via INTRAVENOUS
  Administered 2017-04-04: 500 mL via INTRAVENOUS

## 2017-04-04 MED ORDER — TERBUTALINE SULFATE 1 MG/ML IJ SOLN: 0.2500 mg | Freq: Once | INTRAMUSCULAR | Status: DC | PRN

## 2017-04-04 MED ORDER — DIPHENHYDRAMINE HCL 50 MG/ML IJ SOLN: 12.5000 mg | INTRAMUSCULAR | Status: DC | PRN

## 2017-04-04 MED ORDER — OXYTOCIN 40 UNITS IN LACTATED RINGERS INFUSION - SIMPLE MED
1.0000 m[IU]/min | INTRAVENOUS | Status: DC
  Administered 2017-04-04: 1 m[IU]/min via INTRAVENOUS

## 2017-04-04 MED ORDER — AMMONIA AROMATIC IN INHA
RESPIRATORY_TRACT | Status: AC
  Filled 2017-04-04: qty 10

## 2017-04-04 MED ORDER — MISOPROSTOL 200 MCG PO TABS
ORAL_TABLET | ORAL | Status: AC
  Filled 2017-04-04: qty 4

## 2017-04-04 MED ORDER — LACTATED RINGERS IV SOLN
INTRAVENOUS | Status: DC
  Administered 2017-04-04 (×2): 1000 mL via INTRAVENOUS

## 2017-04-04 MED ORDER — OXYTOCIN 40 UNITS IN LACTATED RINGERS INFUSION - SIMPLE MED
2.5000 [IU]/h | INTRAVENOUS | Status: DC
  Filled 2017-04-04: qty 1000

## 2017-04-04 NOTE — Progress Notes (Signed)
Pt seen w CNM, see H&P Preeclampsia, 39 weeks, need for delivery, prior CS Pros and cons of TOLAC discussed, as well as need for delivery due to her diagnoses. Will proceed cautiously with labor monitoring and Pitocin  Annamarie MajorPaul Benetta Maclaren, MD, Merlinda FrederickFACOG [facility], [facility] 04/04/2017  12:57 PM

## 2017-04-04 NOTE — Progress Notes (Signed)
L&D Progress Note  Pitocin augmentation begun around 1600 and VBAC protocol initiated SROM for clear fluid at 1750  S: Good relief of pain with epidural  O: BP 137/86   Pulse 75   Temp 98.3 F (36.8 C) (Oral)   Resp 18   LMP 07/02/2016 (Approximate)   SpO2 100%   BP range: 140s to 150s/80s to 93 prior to epidural  General: Comfortable FHR: 125-130 with accelerations, moderate variability. Runs of repetitive early/variables to 110 with contractions intermittently since SROM Toco: Contractions every 3-6 min apart with coupling with Pitocin at 4 miu/min  Cervix: 3-3.5/80%/-1/mid IUPC inserted  A/P: Some progress in early labor Mild variable decels-position changes and monitor closely, consider amnioinfusion.

## 2017-04-04 NOTE — Anesthesia Procedure Notes (Signed)
Epidural Patient location during procedure: OB Start time: 04/04/2017 6:29 PM End time: 04/04/2017 6:57 PM  Staffing Performed: anesthesiologist   Preanesthetic Checklist Completed: patient identified, site marked, surgical consent, pre-op evaluation, timeout performed, IV checked, risks and benefits discussed and monitors and equipment checked  Epidural Patient position: sitting Prep: Betadine Patient monitoring: heart rate, continuous pulse ox and blood pressure Approach: midline Location: L4-L5 Injection technique: LOR saline  Needle:  Needle type: Tuohy  Needle gauge: 17 G Needle length: 9 cm and 9 Catheter type: closed end flexible Catheter size: 20 Guage Catheter at skin depth: 10 cm Test dose: negative and 1.5% lidocaine with Epi 1:200 K  Assessment Events: blood not aspirated, injection not painful, no injection resistance, negative IV test and no paresthesia  Additional Notes   Patient tolerated the insertion well without complications.Reason for block:procedure for pain

## 2017-04-04 NOTE — H&P (Signed)
OB History & Physical   History of Present Illness:  Chief Complaint:   HPI:  Cindy Lee is a 26 y.o. G36P1011 female at [redacted]w[redacted]d dated by LMP.  Her pregnancy has been complicated by history of previous Cesarean section for FITL after an IOL for preeclampsia 18 mos ago.Cindy Lee was SGA with G1. She has also had pruritis this pregnancy with normal bile acids. She presents to L&D for evaluation of labor. She complained of painful contractions since 0100 this AM and was checked in the office. Cervix was 1 cm and very posterior. SROM check was negative. No vaginal bleeding. Has been having some biparietal headaches and some scintillating scotomata recently, but none today. Has pain in lower abdomen and lower back with contractions, but no RUQ pain or pain between contractions.     Prenatal care site: Prenatal care at [facility] has been remarkable for   Clinic [facility] Prenatal Labs  Dating LMP Blood type: B/Positive/-- (05/18 1142)   Genetic Screen 1 Screen:    AFP:     Quad:     NIPS: Antibody:Negative (05/18 1142)  Anatomic [place] 11/24/16 Rubella: 16.80 (05/18 1142) Varicella IMMUNE  GTT Early:               Third trimester: 132 RPR: Non Reactive (05/18 1142)   Flu vaccine  HBsAg: Negative (05/18 1142)   TDaP vaccine          Declined                                     Rhogam: HIV: Non Reactive (05/18 1142)   Baby Food        Bottle                                     GBS:  negative  Contraception  Depo Pap: NIL 10/15/16  Circumcision  Hemoglobin AA  Pediatrician    Support Person     Cindy Lee  Maternal Medical History:   Past Medical History:  Diagnosis Date  . Hx of preeclampsia, prior pregnancy, currently pregnant   . Medical history non-contributory   . Previous cesarean section    Past Surgical History: Cesarean section for FITL  09/12/18 OB History  Gravida Para Term Preterm AB Living  3 1 1   1 1   SAB TAB Ectopic Multiple Live Births        0 1    # Outcome Date GA Lbr  Len/2nd Weight Sex Delivery Anes PTL Lv  3 Current           2 Term 09/12/15 [redacted]w[redacted]d  2.42 kg (5 lb 5.4 oz) M CS-LTranv Spinal  LIV  1 AB              No Known Allergies  Prior to Admission medications   Medication Sig Start Date End Date Taking? Authorizing Provider  Prenatal Vit-Fe Fumarate-FA (MULTIVITAMIN-PRENATAL) 27-0.8 MG TABS tablet Take 1 tablet by mouth daily at 12 noon.    [provider]       Social History: She  reports that she quit smoking about 5 months ago. Her smoking use included cigarettes. she has never used smokeless tobacco. She reports that she does not drink alcohol or use drugs.  Family History: family history includes Brain cancer (age of  onset: 6454) in her mother; Breast cancer (age of onset: 4043) in her paternal grandmother; Colon cancer (age of onset: 6850) in her paternal grandmother; Liver cancer in her maternal grandmother; Ovarian cancer (age of onset: 2863) in her maternal grandmother.   Review of Systems: Negative x 10 systems reviewed except as noted in the HPI.      Physical Exam:  Vital Signs: BP 139/85   Pulse 100   Temp 97.6 F (36.4 C) (Oral)   Resp 18   LMP 07/02/2016 (Approximate)   Patient Vitals for the past 24 hrs:  BP Temp Temp src Pulse Resp  04/04/17 1124 139/85 - - 100 -  04/04/17 1109 (!) 153/87 - - 97 -  04/04/17 1054 (!) 152/86 - - 93 -  04/04/17 1040 (!) 145/97 97.6 F (36.4 C) Oral 98 18  General: gravid BF breathing and grimacing with contractions.  HEENT: normocephalic, atraumatic Heart: regular rate & rhythm.  No murmurs/rubs/gallops Lungs: clear to auscultation bilaterally Abdomen: soft, gravid, tender with contractions  EFW: 6# Pelvic:   External: Normal external female genitalia  Cervix: extremely posterior/ ?1cm/ 80%/ -1/ vertex on Leopold's   Extremities: non-tender, symmetric, trace edema bilaterally.  DTRs: +2  Neurologic: Alert & oriented x 3.   Baseline FHR: 135 baseline with accelerations to 150s  to 160, moderate variability  Results for orders placed or performed during the hospital encounter of 04/04/17 (from the past 24 hour(s))  CBC     Status: Abnormal   Collection Time: 04/04/17 11:10 AM  Result Value Ref Range   WBC 3.9 3.6 - 11.0 K/uL   RBC 3.91 3.80 - 5.20 MIL/uL   Hemoglobin 10.8 (L) 12.0 - 16.0 g/dL   HCT 16.132.7 (L) 09.635.0 - 04.547.0 %   MCV 83.7 80.0 - 100.0 fL   MCH 27.7 26.0 - 34.0 pg   MCHC 33.1 32.0 - 36.0 g/dL   RDW 40.915.9 (H) 81.111.5 - 91.414.5 %   Platelets 250 150 - 440 K/uL  Type and screen [facility]     Status: None   Collection Time: 04/04/17 11:10 AM  Result Value Ref Range   ABO/RH(D) B POS    Antibody Screen NEG    Sample Expiration 04/07/2017   Comprehensive metabolic panel     Status: Abnormal   Collection Time: 04/04/17 11:10 AM  Result Value Ref Range   Sodium 134 (L) 135 - 145 mmol/L   Potassium 3.6 3.5 - 5.1 mmol/L   Chloride 105 101 - 111 mmol/L   CO2 20 (L) 22 - 32 mmol/L   Glucose, Bld 130 (H) 65 - 99 mg/dL   BUN 9 6 - 20 mg/dL   Creatinine, Ser 7.820.61 0.44 - 1.00 mg/dL   Calcium 8.7 (L) 8.9 - 10.3 mg/dL   Total Protein 6.7 6.5 - 8.1 g/dL   Albumin 2.9 (L) 3.5 - 5.0 g/dL   AST 35 15 - 41 U/L   ALT 17 14 - 54 U/L   Alkaline Phosphatase 184 (H) 38 - 126 U/L   Total Bilirubin 0.8 0.3 - 1.2 mg/dL   GFR calc non Af Amer >60 >60 mL/min   GFR calc Af Amer >60 >60 mL/min   Anion gap 9 5 - 15  Protein / creatinine ratio, urine     Status: Abnormal   Collection Time: 04/04/17 11:10 AM  Result Value Ref Range   Creatinine, Urine 146 mg/dL   Total Protein, Urine 118 mg/dL   Protein Creatinine  Ratio 0.81 (H) 0.00 - 0.15 mg/mg[Cre]   Assessment:  Cindy Lee is a 26 y.o. G44P1011 female at [redacted]w[redacted]d with preeclampsia without severe features Prior Cesarean section, desires TOLAC FWB: Cat 1 tracing  Plan:  1. Admit to Labor & Delivery   2. Discussed indication for delivery with preeclampsia at term. Explained risks of uterine  rupture with induction /augmentation of labor > than with spontaneous labor. Also explained that in case of uterine rupture, may not be able to do emergency Cesarean section fast enough to prevent hypoxic event that may result in permanent neurological damage to infant or even death. Patient wants to try to deliver vaginally since recovery will be quicker and she needs to care for a 4 mos old child. Will try gentle Pitocin augmentation and will initiate VBAC protocol at that time. Do not think it is technically possible to insert foley bulb at this time. 3. GBS negative.   4. Consents obtained. 5. Stadol for pain if needed.  Farrel Conners  04/04/2017 12:58 PM

## 2017-04-04 NOTE — Anesthesia Preprocedure Evaluation (Signed)
Anesthesia Evaluation  Patient identified by MRN, date of birth, ID band Patient awake    Reviewed: Allergy & Precautions, NPO status , Patient's Chart, lab work & pertinent test results  History of Anesthesia Complications Negative for: history of anesthetic complications  Airway Mallampati: II       Dental   Pulmonary neg sleep apnea, neg COPD, former smoker,           Cardiovascular hypertension (pre-eclampsia), (-) Past MI and (-) CHF (-) dysrhythmias (-) Valvular Problems/Murmurs     Neuro/Psych neg Seizures    GI/Hepatic Neg liver ROS, neg GERD  ,  Endo/Other  neg diabetes  Renal/GU negative Renal ROS     Musculoskeletal   Abdominal   Peds  Hematology   Anesthesia Other Findings   Reproductive/Obstetrics                             Anesthesia Physical Anesthesia Plan  ASA: II  Anesthesia Plan: Epidural   Post-op Pain Management:    Induction:   PONV Risk Score and Plan: 2  Airway Management Planned:   Additional Equipment:   Intra-op Plan:   Post-operative Plan:   Informed Consent: I have reviewed the patients History and Physical, chart, labs and discussed the procedure including the risks, benefits and alternatives for the proposed anesthesia with the patient or authorized representative who has indicated his/her understanding and acceptance.     Plan Discussed with:   Anesthesia Plan Comments:         Anesthesia Quick Evaluation

## 2017-04-04 NOTE — Progress Notes (Signed)
Pt c/o ctx 10  mins apart lasting 45 secs to 1 min since 5 am. Feels like she may be leaking a small amount of fluid.

## 2017-04-04 NOTE — OB Triage Note (Signed)
Patient presents to labor and delivery for contractions after being [facility] for monitoring and evaluation of labor.  Patient states she had a c/s 18 months ago and [facility]  Patient denies any vaginal bleeding, states she had a small amount of discharge, however [facility] performed test and determined she was not ruptured.  Patient states she has a mild headache but has experienced them throughout pregnancy.  Karren Cobbleolleen Guiterrez, CNM on the unit, report given, plan to monitor patient and BP, CNM to reassess.

## 2017-04-04 NOTE — Plan of Care (Signed)
  Progressing Education: Knowledge of Childbirth will improve 04/04/2017 2307 - Progressing by Candyce ChurnNorris, Moishe Schellenberg, RN Ability to make informed decisions regarding treatment and plan of care will improve 04/04/2017 2307 - Progressing by Candyce ChurnNorris, Myeshia Fojtik, RN Ability to state and carry out methods to decrease the pain will improve 04/04/2017 2307 - Progressing by Candyce ChurnNorris, Lemar Bakos, RN Coping: Ability to verbalize concerns and feelings about labor and delivery will improve 04/04/2017 2307 - Progressing by Candyce ChurnNorris, Kristell Wooding, RN Life Cycle: Ability to make normal progression through stages of labor will improve 04/04/2017 2307 - Progressing by Candyce ChurnNorris, Indyah Saulnier, RN Ability to effectively push during vaginal delivery will improve 04/04/2017 2307 - Progressing by Candyce ChurnNorris, Keirra Zeimet, RN Role Relationship: Ability to demonstrate positive interaction with the child will improve 04/04/2017 2307 - Progressing by Candyce ChurnNorris, Eadie Repetto, RN Safety: Risk of complications during labor and delivery will decrease 04/04/2017 2307 - Progressing by Candyce ChurnNorris, Tamila Gaulin, RN Pain Management: Relief or control of pain from uterine contractions will improve 04/04/2017 2307 - Progressing by Candyce ChurnNorris, Caydyn Sprung, RN Health Behavior/Discharge Planning: Ability to manage health-related needs will improve 04/04/2017 2307 - Progressing by Candyce ChurnNorris, Deondria Puryear, RN Clinical Measurements: Ability to maintain clinical measurements within normal limits will improve 04/04/2017 2307 - Progressing by Candyce ChurnNorris, Unknown Schleyer, RN Will remain free from infection 04/04/2017 2307 - Progressing by Candyce ChurnNorris, Orabelle Rylee, RN Diagnostic test results will improve 04/04/2017 2307 - Progressing by Candyce ChurnNorris, Nyle Limb, RN Respiratory complications will improve 04/04/2017 2307 - Progressing by Candyce ChurnNorris, Rook Maue, RN Cardiovascular complication will be avoided 04/04/2017 2307 - Progressing by Candyce ChurnNorris, Machi Whittaker, RN Activity: Risk for activity intolerance will  decrease 04/04/2017 2307 - Progressing by Candyce ChurnNorris, Christmas Faraci, RN Nutrition: Adequate nutrition will be maintained 04/04/2017 2307 - Progressing by Candyce ChurnNorris, Mayzie Caughlin, RN Coping: Level of anxiety will decrease 04/04/2017 2307 - Progressing by Candyce ChurnNorris, Joyous Gleghorn, RN Elimination: Will not experience complications related to bowel motility 04/04/2017 2307 - Progressing by Candyce ChurnNorris, Keyana Guevara, RN Will not experience complications related to urinary retention 04/04/2017 2307 - Progressing by Candyce ChurnNorris, Everson Mott, RN Pain Managment: General experience of comfort will improve 04/04/2017 2307 - Progressing by Candyce ChurnNorris, Kashtyn Jankowski, RN Safety: Ability to remain free from injury will improve 04/04/2017 2307 - Progressing by Candyce ChurnNorris, Shalyn Koral, RN Skin Integrity: Risk for impaired skin integrity will decrease 04/04/2017 2307 - Progressing by Candyce ChurnNorris, Emilio Baylock, RN

## 2017-04-04 NOTE — Progress Notes (Signed)
Routine Prenatal Care Visit  Subjective  Cindy Lee is a 26 y.o. G3P1011 at [redacted]w[redacted]d being seen today for ongoing prenatal care.  She is currently monitored for the following issues for this high-risk pregnancy and has Supervision of other normal pregnancy, antepartum; History of gestational hypertension; Pruritus; History of cesarean delivery; Elevated blood pressure affecting pregnancy in third trimester, antepartum; and Labor and delivery indication for care or intervention on their problem list.  ----------------------------------------------------------------------------------- Patient reports contractions since 1am, worsened at 5am..   Contractions: Regular.  .  Movement: Present. Denies leaking of fluid.  ----------------------------------------------------------------------------------- The following portions of the patient's history were reviewed and updated as appropriate: allergies, current medications, past family history, past medical history, past social history, past surgical history and problem list. Problem list updated.   Objective  Blood pressure 120/70, weight 142 lb (64.4 kg), last menstrual period 07/02/2016, unknown if currently breastfeeding. Pregravid weight 97 lb (44 kg) Total Weight Gain 45 lb (20.4 kg) Urinalysis: Urine Protein: Trace Urine Glucose: Negative  Fetal Status: Fetal Heart Rate (bpm): 135 Fundal Height: 39 cm Movement: Present  Presentation: Vertex  General:  Alert, oriented and cooperative. Patient is in no acute distress.  Skin: Skin is warm and dry. No rash noted.   Cardiovascular: Normal heart rate noted  Respiratory: Normal respiratory effort, no problems with respiration noted  Abdomen: Soft, gravid, appropriate for gestational age. Pain/Pressure: Present     Pelvic:  Cervical exam performed        Extremities: Normal range of motion.     ental Status: Normal mood and affect. Normal behavior. Normal judgment and thought content.      Assessment   26 y.o. G3P1011 at [redacted]w[redacted]d by  04/08/2017, by Last Menstrual Period presenting for work-in prenatal visit  Plan   Pregnancy #3 Problems (from 06/30/16 to present)    Problem Noted Resolved   Elevated blood pressure affecting pregnancy in third trimester, antepartum 03/21/2017 by Conard Novak, MD No   History of cesarean delivery 03/07/2017 by Nadara Mustard, MD No   Supervision of other normal pregnancy, antepartum 10/15/2016 by Tresea Mall, CNM No   Overview Signed 11/24/2016  5:01 PM by Vena Austria, MD     Clinic [facility] Prenatal Labs  Dating  Blood type: B/Positive/-- (05/18 1142)   Genetic Screen 1 Screen:    AFP:     Quad:     NIPS: Antibody:Negative (05/18 1142)  Anatomic [place] 11/24/16 Rubella: 16.80 (05/18 1142)  GTT Early:               Third trimester:  RPR: Non Reactive (05/18 1142)   Flu vaccine  HBsAg: Negative (05/18 1142)   TDaP vaccine                                               Rhogam: HIV: Non Reactive (05/18 1142)   Baby Food                                               GBS: (For PCN allergy, check sensitivities)  Contraception  Pap:  Circumcision    Pediatrician    Support Person  History of gestational hypertension 10/15/2016 by Tresea Mall, CNM No       Term labor symptoms and general obstetric precautions including but not limited to vaginal bleeding, contractions, leaking of fluid and fetal movement were reviewed in detail with the patient. Please refer to After Visit Summary for other counseling recommendations.  Requested that patient report to labor and delivery today for monitoring of TOLAC.   No Follow-up on file.

## 2017-04-05 ENCOUNTER — Other Ambulatory Visit: Payer: Self-pay

## 2017-04-05 ENCOUNTER — Encounter: Payer: Self-pay | Admitting: *Deleted

## 2017-04-05 DIAGNOSIS — O34219 Maternal care for unspecified type scar from previous cesarean delivery: Secondary | ICD-10-CM | POA: Diagnosis not present

## 2017-04-05 DIAGNOSIS — O34211 Maternal care for low transverse scar from previous cesarean delivery: Secondary | ICD-10-CM

## 2017-04-05 DIAGNOSIS — Z3A39 39 weeks gestation of pregnancy: Secondary | ICD-10-CM

## 2017-04-05 DIAGNOSIS — Z8759 Personal history of other complications of pregnancy, childbirth and the puerperium: Secondary | ICD-10-CM | POA: Diagnosis present

## 2017-04-05 LAB — RPR: RPR Ser Ql: NONREACTIVE

## 2017-04-05 MED ORDER — SIMETHICONE 80 MG PO CHEW: 80.0000 mg | CHEWABLE_TABLET | ORAL | Status: DC | PRN

## 2017-04-05 MED ORDER — WITCH HAZEL-GLYCERIN EX PADS: 1.0000 "application " | MEDICATED_PAD | CUTANEOUS | Status: DC | PRN

## 2017-04-05 MED ORDER — SODIUM CHLORIDE 0.9 % IV SOLN
2.0000 g | Freq: Four times a day (QID) | INTRAVENOUS | Status: AC
  Administered 2017-04-05: 2 g via INTRAVENOUS
  Filled 2017-04-05 (×2): qty 2000

## 2017-04-05 MED ORDER — COCONUT OIL OIL: 1.0000 "application " | TOPICAL_OIL | Status: DC | PRN

## 2017-04-05 MED ORDER — SENNOSIDES-DOCUSATE SODIUM 8.6-50 MG PO TABS
2.0000 | ORAL_TABLET | ORAL | Status: DC
  Administered 2017-04-05: 2 via ORAL
  Filled 2017-04-05: qty 2

## 2017-04-05 MED ORDER — ONDANSETRON HCL 4 MG PO TABS: 4.0000 mg | ORAL_TABLET | ORAL | Status: DC | PRN

## 2017-04-05 MED ORDER — FERROUS SULFATE 325 (65 FE) MG PO TABS
325.0000 mg | ORAL_TABLET | Freq: Every day | ORAL | Status: DC
  Administered 2017-04-06: 325 mg via ORAL
  Filled 2017-04-05 (×2): qty 1

## 2017-04-05 MED ORDER — IBUPROFEN 600 MG PO TABS
600.0000 mg | ORAL_TABLET | Freq: Four times a day (QID) | ORAL | Status: DC
  Administered 2017-04-05 – 2017-04-06 (×5): 600 mg via ORAL
  Filled 2017-04-05 (×5): qty 1

## 2017-04-05 MED ORDER — FENTANYL 2.5 MCG/ML W/ROPIVACAINE 0.15% IN NS 100 ML EPIDURAL (ARMC)
EPIDURAL | Status: AC
  Filled 2017-04-05: qty 100

## 2017-04-05 MED ORDER — OXYCODONE-ACETAMINOPHEN 5-325 MG PO TABS
1.0000 | ORAL_TABLET | ORAL | Status: DC | PRN
  Administered 2017-04-05 – 2017-04-06 (×3): 1 via ORAL
  Filled 2017-04-05 (×2): qty 1

## 2017-04-05 MED ORDER — ONDANSETRON HCL 4 MG/2ML IJ SOLN: 4.0000 mg | INTRAMUSCULAR | Status: DC | PRN

## 2017-04-05 MED ORDER — BENZOCAINE-MENTHOL 20-0.5 % EX AERO
1.0000 "application " | INHALATION_SPRAY | CUTANEOUS | Status: DC | PRN
  Filled 2017-04-05: qty 56

## 2017-04-05 MED ORDER — DIBUCAINE 1 % RE OINT: 1.0000 "application " | TOPICAL_OINTMENT | RECTAL | Status: DC | PRN

## 2017-04-05 MED ORDER — OXYCODONE-ACETAMINOPHEN 5-325 MG PO TABS
2.0000 | ORAL_TABLET | ORAL | Status: DC | PRN
  Administered 2017-04-05: 1 via ORAL
  Filled 2017-04-05 (×2): qty 2

## 2017-04-05 MED ORDER — PRENATAL MULTIVITAMIN CH
1.0000 | ORAL_TABLET | Freq: Every day | ORAL | Status: DC
  Administered 2017-04-06: 1 via ORAL
  Filled 2017-04-05: qty 1

## 2017-04-05 NOTE — Discharge Summary (Signed)
Physician Obstetric Discharge Summary  Patient ID: Cindy Lee MRN: [id number] DOB/AGE: May 29, 1991 26 y.o.   Date of Admission: 04/04/2017  Date of Discharge:  04/06/17  Admitting Diagnosis: Onset of Labor at 356w4d  Secondary Diagnosis: Preeclampsia and previous cesarean section  Mode of Delivery: normal spontaneous vaginal delivery 04/05/2017      Discharge Diagnosis: Chorioamnionitis, Preeclampsia (mild) and Vaginal birth after Cesarean section   Intrapartum Procedures: epidural, placement of fetal scalp electrode, placement of intrauterine catheter and Pitocin induction/augmentation, repair of second degree laceration. Persistent OP presentation   Post partum procedures: IV antibiotics for possible chorioamnionitis  Complications: maternal fever/ possible chorioamnionitis/ persistent OP presentation/ preeclampsia   Brief Hospital Course  Cindy Lee is a [id number] who had a VBAC on 04/05/2017 after a Pitocin induction/augmentation for preeclampsia;  for further details of this delivery, please refer to the delivery note. Her labor was complicated by variable decelerations, persistent OP presentation, fetal tachycardia in the second stage, and maternal fever immediately postpartum. Patient had an uncomplicated postpartum course.  By time of discharge on PPD#1, her pain was controlled on oral pain medications; she had appropriate lochia and was ambulating, voiding without difficulty and tolerating regular diet.  She was deemed stable for discharge to home.     Labs: CBC Latest Ref Rng & Units 04/06/2017 04/04/2017 03/21/2017  WBC 3.6 - 11.0 K/uL 15.3(H) 3.9 4.9  Hemoglobin 12.0 - 16.0 g/dL 8.0(L) 10.8(L) 10.1(L)  Hematocrit 35.0 - 47.0 % 25.1(L) 32.7(L) 31.4(L)  Platelets 150 - 440 K/uL 213 250 254   B POS RI/ VI  Physical exam:  Blood pressure 121/72, pulse 86, temperature 97.8 F (36.6 C), temperature source Oral, resp. rate 18, height 5\' 4"  (1.626 m), weight 142 lb  (64.4 kg), last menstrual period 07/02/2016, SpO2 100 %, unknown if currently breastfeeding. General: alert and no distress Lochia: appropriate Abdomen: soft, NT Uterine Fundus: firm Extremities: No evidence of DVT seen on physical exam. No lower extremity edema.  Discharge Instructions: Per After Visit Summary. Activity: Advance as tolerated. Pelvic rest for 6 weeks.  Also refer to Discharge Instructions Diet: Regular Medications: Allergies as of 04/06/2017   No Known Allergies     Medication List    TAKE these medications   benzocaine-Menthol 20-0.5 % Aero Commonly known as:  DERMOPLAST Apply 1 application as needed topically for irritation (perineal discomfort).   ibuprofen 600 MG tablet Commonly known as:  ADVIL,MOTRIN Take 1 tablet (600 mg total) every 6 (six) hours by mouth.   medroxyPROGESTERone 150 MG/ML injection Commonly known as:  DEPO-PROVERA Inject 1 mL (150 mg total) every 3 (three) months into the muscle.   multivitamin-prenatal 27-0.8 MG Tabs tablet Take 1 tablet by mouth daily at 12 noon.            Discharge Care Instructions  (From admission, onward)        Start     Ordered   04/06/17 0000  Discharge wound care:    Comments:  SHOWER DAILY Wash incision gently with soap and water.  Call office with any drainage, redness, or firmness of the incision.   04/06/17 1134     Outpatient follow up:  Follow-up Information    Farrel ConnersGutierrez, Colleen, CNM. Schedule an appointment as soon as possible for a visit in 1 week(s).   Specialty:  Certified Nurse Midwife Why:  for BP check postpartum Contact information: [address] [place] [address] [phone]          Postpartum  contraception: Depo-Provera  Discharged Condition: good  Discharged to: home   Newborn Data: Cindy Lee/ 7#6.2oz Disposition:home with mother  Apgars: APGAR (1 MIN): 8   APGAR (5 MINS): 8   APGAR (10 MINS):    Baby Feeding: Bottle  Natale Milchhristanna R Cindy Renfrow,  MD 04/06/2017 11:38 AM

## 2017-04-05 NOTE — Discharge Instructions (Signed)
°Vaginal Delivery, Care After °Refer to this sheet in the next few weeks. These discharge instructions provide you with information on caring for yourself after delivery. Your caregiver may also give you specific instructions. Your treatment has been planned according to the most current medical practices available, but problems sometimes occur. Call your caregiver if you have any problems or questions after you go home. °HOME CARE INSTRUCTIONS °1. Take over-the-counter or prescription medicines only as directed by your caregiver or pharmacist. °2. Do not drink alcohol, especially if you are breastfeeding or taking medicine to relieve pain. °3. Do not smoke tobacco. °4. Continue to use good perineal care. Good perineal care includes: °1. Wiping your perineum from back to front °2. Keeping your perineum clean. °3. You can do sitz baths twice a day, to help keep this area clean °5. Do not use tampons, douche or have sex for 6 weeks °6. Shower only and avoid sitting in submerged water, aside from sitz baths °7. Wear a well-fitting bra that provides breast support. °8. Eat healthy foods. °9. Drink enough fluids to keep your urine clear or pale yellow. °10. Eat high-fiber foods such as whole grain cereals and breads, brown rice, beans, and fresh fruits and vegetables every day. These foods may help prevent or relieve constipation. °11. Avoid constipation with high fiber foods or medications, such as miralax or metamucil °12. Follow your caregiver's recommendations regarding resumption of activities such as climbing stairs, driving, lifting, exercising, or traveling. °13. Talk to your caregiver about resuming sexual activities. Resumption of sexual activities after 6 weeks is dependent upon your risk of infection, your rate of healing, and your comfort and desire to resume sexual activity. °14. Try to have someone help you with your household activities and your newborn for at least a few days after you leave the  hospital. °15. Rest as much as possible. Try to rest or take a nap when your newborn is sleeping. °16. Increase your activities gradually. °17. Keep all of your scheduled postpartum appointments. It is very important to keep your scheduled follow-up appointments. At these appointments, your caregiver will be checking to make sure that you are healing physically and emotionally. °SEEK MEDICAL CARE IF:  °· You are passing large clots from your vagina. Save any clots to show your caregiver. °· You have a foul smelling discharge from your vagina. °· You have trouble urinating. °· You are urinating frequently. °· You have pain when you urinate. °· You have a change in your bowel movements. °· You have increasing redness, pain, or swelling near your vaginal incision (episiotomy) or vaginal tear. °· You have pus draining from your episiotomy or vaginal tear. °· Your episiotomy or vaginal tear is separating. °· You have painful, hard, or reddened breasts. °· You have a severe headache. °· You have blurred vision or see spots. °· You feel sad or depressed. °· You have thoughts of hurting yourself or your newborn. °· You have questions about your care, the care of your newborn, or medicines. °· You are dizzy or light-headed. °· You have a rash. °· You have nausea or vomiting. °· You were breastfeeding and have not had a menstrual period within 12 weeks after you stopped breastfeeding. °· You are not breastfeeding and have not had a menstrual period by the 12th week after delivery. °· You have a fever of 100.5 or more °SEEK IMMEDIATE MEDICAL CARE IF:  °· You have persistent pain. °· You have chest pain. °· You have shortness   of breath. °· You faint. °· You have leg pain. °· You have stomach pain. °· Your vaginal bleeding saturates two or more sanitary pads in 1 hour. °MAKE SURE YOU:  °· Understand these instructions. °· Will watch your condition. °· Will get help right away if you are not doing well or get worse. °Document  Released: 05/14/2000 Document Revised: 10/01/2013 Document Reviewed: 01/12/2012 °ExitCare® Patient Information ©2015 [employer]. This information is not intended to replace advice given to you by your health care provider. Make sure you discuss any questions you have with your health care provider. ° °Sitz Bath °A sitz bath is a warm water bath taken in the sitting position. The water covers only the hips and butt (buttocks). We recommend using one that fits in the toilet, to help with ease of use and cleanliness. It may be used for either healing or cleaning purposes. Sitz baths are also used to relieve pain, itching, or muscle tightening (spasms). The water may contain medicine. Moist heat will help you heal and relax.  °HOME CARE  °Take 3 to 4 sitz baths a day. °18. Fill the bathtub half-full with warm water. °19. Sit in the water and open the drain a little. °20. Turn on the warm water to keep the tub half-full. Keep the water running constantly. °21. Soak in the water for 15 to 20 minutes. °22. After the sitz bath, pat the affected area dry. °GET HELP RIGHT AWAY IF: °You get worse instead of better. Stop the sitz baths if you get worse. °MAKE SURE YOU: °· Understand these instructions. °· Will watch your condition. °· Will get help right away if you are not doing well or get worse. °Document Released: 06/24/2004 Document Revised: 02/09/2012 Document Reviewed: 09/14/2010 °ExitCare® Patient Information ©2015 [employer]. This information is not intended to replace advice given to you by your health care provider. Make sure you discuss any questions you have with your health care provider. ° ° °

## 2017-04-05 NOTE — Progress Notes (Addendum)
L&D Progress Note  Pitocin was discontinued at 2100 after variable decelerations became deeper and lasted longer and there was a question of decelerations being late. Cervix was 4 cm dilated at that time. Decelerations resolved with position changes and IV bolus.  Since then has had recurrence of runs of variable decelerations that resolve with position changes. She has progressed on her own to 5 cm at 2215 then to 7 cm at 2339.   S: Patient now feeling urge to push with contractions. Also starting to feel contractions on right side (has been on her left side)  O: BP (!) 130/99   Pulse (!) 101   Temp 98.4 F (36.9 C) (Oral)   Resp 18   LMP 07/02/2016 (Approximate)   SpO2 100%   General: tremors at times FHR: currently 130s with accelerations to 150s to 160, moderate variability. FSE applied earlier Toco: contractions q 4-6 min apart with coupling Cervix: 8-9/90/0  A: Progressing Intermittent Cat 2 tracing Mild range blood pressures  P: Position change to right side Continue to monitor progress and fetal well being. Dr Tiburcio PeaHarris notified of intermittent decelerations and is present on the unit.  Cindy Lee, CNM

## 2017-04-06 LAB — CBC
HCT: 25.1 % — ABNORMAL LOW (ref 35.0–47.0)
Hemoglobin: 8 g/dL — ABNORMAL LOW (ref 12.0–16.0)
MCH: 26.7 pg (ref 26.0–34.0)
MCHC: 31.9 g/dL — ABNORMAL LOW (ref 32.0–36.0)
MCV: 83.8 fL (ref 80.0–100.0)
Platelets: 213 10*3/uL (ref 150–440)
RBC: 3 MIL/uL — ABNORMAL LOW (ref 3.80–5.20)
RDW: 16 % — ABNORMAL HIGH (ref 11.5–14.5)
WBC: 15.3 10*3/uL — ABNORMAL HIGH (ref 3.6–11.0)

## 2017-04-06 MED ORDER — IBUPROFEN 600 MG PO TABS: 600.0000 mg | ORAL_TABLET | Freq: Four times a day (QID) | ORAL | 0 refills | Status: DC

## 2017-04-06 MED ORDER — MEDROXYPROGESTERONE ACETATE 150 MG/ML IM SUSP: 150.0000 mg | INTRAMUSCULAR | 3 refills | Status: DC

## 2017-04-06 MED ORDER — MEDROXYPROGESTERONE ACETATE 150 MG/ML IM SUSP
150.0000 mg | Freq: Once | INTRAMUSCULAR | Status: AC
  Administered 2017-04-06: 150 mg via INTRAMUSCULAR
  Filled 2017-04-06: qty 1

## 2017-04-06 MED ORDER — BENZOCAINE-MENTHOL 20-0.5 % EX AERO: 1.0000 "application " | INHALATION_SPRAY | CUTANEOUS | 0 refills | Status: DC | PRN

## 2017-04-06 NOTE — Anesthesia Postprocedure Evaluation (Signed)
Anesthesia Post Note  Patient: Cindy Lee  Procedure(s) Performed: AN AD HOC LABOR EPIDURAL  Patient location during evaluation: Mother Baby Anesthesia Type: Epidural Level of consciousness: awake and alert and oriented Pain management: pain level controlled Vital Signs Assessment: post-procedure vital signs reviewed and stable Respiratory status: respiratory function stable Postop Assessment: no headache, no backache, epidural receding, patient able to bend at knees, no apparent nausea or vomiting and adequate PO intake Anesthetic complications: no     Last Vitals:  Vitals:   04/05/17 2327 04/06/17 0300  BP: 134/86 121/76  Pulse: 86 89  Resp: 18 18  Temp: 36.9 C 36.7 C  SpO2: 100% 100%    Last Pain:  Vitals:   04/06/17 0630  TempSrc:   PainSc: 6                  Clydene PughBeane, Clydell Sposito D

## 2017-04-06 NOTE — Progress Notes (Signed)
Pt discharged with infant.  Discharge instructions, prescriptions and follow up appointment given to and reviewed with pt. Pt verbalized understanding. Escorted out by staff. 

## 2017-04-06 NOTE — Progress Notes (Signed)
Admit Date: 04/04/2017 Today's Date: 04/06/2017  Post Partum Day 1  Subjective:  no complaints, up ad lib, voiding, tolerating PO and + flatus  Objective: Temp:  [97.8 F (36.6 C)-98.5 F (36.9 C)] 97.8 F (36.6 C) (11/07 0749) Pulse Rate:  [86-107] 86 (11/07 0749) Resp:  [18-20] 18 (11/07 0749) BP: (121-148)/(71-88) 121/72 (11/07 0749) SpO2:  [98 %-100 %] 100 % (11/07 0749) Weight:  [142 lb (64.4 kg)] 142 lb (64.4 kg) (11/06 1936)  Physical Exam:  General: alert, cooperative, appears stated age and no distress Lochia: appropriate Uterine Fundus: firm Incision: none DVT Evaluation: No evidence of DVT seen on physical exam.  Recent Labs    04/04/17 1110 04/06/17 0725  HGB 10.8* 8.0*  HCT 32.7* 25.1*    Assessment/Plan: Discharge home, Contraception Depo Provera, Bottle Feeding and Infant doing well   LOS: 2 days   Clairessa Boulet R [employer] [facility] 04/06/2017, 11:39 AM

## 2017-04-08 ENCOUNTER — Encounter: Payer: BLUE CROSS/BLUE SHIELD | Admitting: Obstetrics & Gynecology

## 2017-04-11 ENCOUNTER — Inpatient Hospital Stay: Admission: RE | Admit: 2017-04-11 | Payer: BLUE CROSS/BLUE SHIELD | Source: Ambulatory Visit

## 2017-04-12 ENCOUNTER — Inpatient Hospital Stay: Admit: 2017-04-12 | Payer: BLUE CROSS/BLUE SHIELD | Admitting: Obstetrics & Gynecology

## 2017-04-12 SURGERY — Surgical Case
Anesthesia: Choice

## 2017-04-13 ENCOUNTER — Ambulatory Visit (INDEPENDENT_AMBULATORY_CARE_PROVIDER_SITE_OTHER): Payer: BLUE CROSS/BLUE SHIELD | Admitting: Certified Nurse Midwife

## 2017-04-13 ENCOUNTER — Encounter: Payer: Self-pay | Admitting: Certified Nurse Midwife

## 2017-04-13 VITALS — BP 114/70 | HR 83 | Temp 98.2°F | Ht 64.0 in | Wt 128.0 lb

## 2017-04-13 DIAGNOSIS — O1494 Unspecified pre-eclampsia, complicating childbirth: Secondary | ICD-10-CM

## 2017-04-13 DIAGNOSIS — Z0130 Encounter for examination of blood pressure without abnormal findings: Secondary | ICD-10-CM

## 2017-04-13 NOTE — Progress Notes (Signed)
Postpartum Visit  Chief Complaint:  Chief Complaint  Patient presents with  . Blood Pressure Check    History of Present Illness: Cindy Lee is a 26 y.o. [id number], now 1 week postpartum who presents for postpartum visit. Labor complicated by preeclampsia without severe features (had induction/augmentation), persistent OP, maternal fever immediately pp and postpartum anemia She reports having normal bowel and bladder function. Her second degree perineal laceration is healing well. Baby bottle feeding and growing nicely.   Date of delivery: 04/05/2017 Type of delivery: SVD/ VBAC Episiotomy No.  Laceration: yes, second degree perineal Pregnancy or labor problems:  Yes, preeclampsia without severe features/ Persistent OP/ Fever immediately postpartum Any problems since the delivery: anemia with d/c hmg of 8gm/dl. Taking iron. Denies lightheadedness  Newborn Details:  SINGLETON Baby Boy Keaton:   Birth weight: 7#6oz Maternal Details:  Breast Feeding:  no Post partum depression/anxiety noted:  no    Review of Systems: ROS-see HPI  Past Medical History:  Past Medical History:  Diagnosis Date  . Preeclampsia    with G1 and G2  . Previous cesarean section   . Vaginal birth after cesarean section 04/05/2017    Past Surgical History: LTCS in 2017 for FITL  Family History:  Family History  Problem Relation Age of Onset  . Brain cancer Mother [id number]       Benign  . Ovarian cancer Maternal Grandmother 5063  . Liver cancer Maternal Grandmother   . Breast cancer Paternal Grandmother [id number]  . Colon cancer Paternal Grandmother 5050    Social History:  Social History   Socioeconomic History  . Marital status: Single    Spouse name: Not on file  . Number of children: 2  . Years of education: Not on file  . Highest education level: Not on file  Social Needs  . Financial resource strain: Not on file  . Food insecurity - worry: Not on file  . Food insecurity - inability: Not  on file  . Transportation needs - medical: Not on file  . Transportation needs - non-medical: Not on file  Occupational History  . Not on file  Tobacco Use  . Smoking status: Former Smoker    Types: Cigarettes    Last attempt to quit: 10/05/2016    Years since quitting: 0.5  . Smokeless tobacco: Never Used  Substance and Sexual Activity  . Alcohol use: No  . Drug use: No  . Sexual activity: Not Currently    Birth control/protection: Injection    Comment: Depo  Other Topics Concern  . Not on file  Social History Narrative  . Not on file    Allergies:  No Known Allergies  Medications: Prior to Admission medications   Medication Sig Start Date End Date Taking? Authorizing Provider  medroxyPROGESTERone (DEPO-PROVERA) 150 MG/ML injection Inject 1 mL (150 mg total) every 3 (three) months into the muscle. 04/06/17 07/05/17 Yes Schuman, Jaquelyn Bitterhristanna R, MD  Prenatal Vit-Fe Fumarate-FA (MULTIVITAMIN-PRENATAL) 27-0.8 MG TABS tablet Take 1 tablet by mouth daily at 12 noon.   Yes [provider]  benzocaine-Menthol (DERMOPLAST) 20-0.5 % AERO Apply 1 application as needed topically for irritation (perineal discomfort). Patient not taking: Reported on 04/13/2017 04/06/17   Natale MilchSchuman, Christanna R, MD  ibuprofen (ADVIL,MOTRIN) 600 MG tablet Take 1 tablet (600 mg total) every 6 (six) hours by mouth. Patient not taking: Reported on 04/13/2017 04/06/17   Natale MilchSchuman, Christanna R, MD    Physical Exam Vitals:  Vitals:   04/13/17  1051  BP: 114/70  Pulse: 83  Temp: 98.2 F (36.8 C)    General: BF in good spirits Extremities: no edema, erythema, or tenderness Neurologic: Grossly intact Psychiatric: mood appropriate, affect full  Assessment: 26 y.o. [id number] s/p VBAC Preeclampsia with this pregnancy-delivered-normotensive Anemia   Plan:  1. Take her iron daily. Can take stool softeners or Miralax if needed for constipation. Drink plenty of water.  2. Safety issues regarding anemia  discussed  3. RTO for 6 week check up  Farrel Connersolleen Deniz Eskridge, CNM

## 2017-05-18 ENCOUNTER — Encounter: Payer: Self-pay | Admitting: Certified Nurse Midwife

## 2017-05-18 ENCOUNTER — Ambulatory Visit (INDEPENDENT_AMBULATORY_CARE_PROVIDER_SITE_OTHER): Payer: BLUE CROSS/BLUE SHIELD | Admitting: Certified Nurse Midwife

## 2017-05-18 VITALS — BP 114/66 | HR 87 | Ht 64.0 in | Wt 123.0 lb

## 2017-05-18 DIAGNOSIS — O9081 Anemia of the puerperium: Secondary | ICD-10-CM

## 2017-05-18 LAB — POCT HEMOGLOBIN: Hemoglobin: 12 g/dL — AB (ref 12.2–16.2)

## 2017-05-18 NOTE — Progress Notes (Signed)
Postpartum Visit  Chief Complaint:  Chief Complaint  Patient presents with  . Postpartum Care    6 wk pp    History of Present Illness: Nekesha Jerrye BeaversK Harrower is a 26 y.o. [id number], now 6 week postpartum who presents for a postpartum visit. Labor complicated by preeclampsia without severe features (had induction/augmentation), persistent OP, maternal fever immediately pp and postpartum anemia with hemoglobin down to 8mg m/dl She reports having normal bowel and bladder function. Her second degree perineal laceration is healing well. Baby bottle feeding and growing nicely.   Date of delivery: 04/05/2017 Type of delivery: SVD/ VBAC Episiotomy No.  Laceration: yes, second degree perineal Pregnancy or labor problems:  Yes, preeclampsia without severe features/ Persistent OP/ Fever immediately postpartum Any problems since the delivery: anemia with d/c hmg of 8gm/dl. Taking iron.   Newborn Details:  SINGLETON Baby Boy Keaton:   Birth weight: 7#6oz Maternal Details:  Breast Feeding:  no Post partum depression/anxiety noted:  No EPDS score was 1    Review of Systems: Review of Systems  Constitutional: Negative for chills, fever and weight loss.  HENT: Negative for congestion, sinus pain and sore throat.   Eyes: Negative for blurred vision and pain.  Respiratory: Negative for hemoptysis, shortness of breath and wheezing.   Cardiovascular: Negative for chest pain, palpitations and leg swelling.  Gastrointestinal: Negative for abdominal pain, blood in stool, diarrhea, heartburn, nausea and vomiting.  Genitourinary: Negative for dysuria, frequency, hematuria and urgency.  Musculoskeletal: Negative for back pain, joint pain and myalgias.  Skin: Negative for itching and rash.  Neurological: Negative for dizziness, tingling and headaches.  Endo/Heme/Allergies: Negative for environmental allergies and polydipsia. Does not bruise/bleed easily.       Negative for hirsutism     Psychiatric/Behavioral: Negative for depression. The patient is not nervous/anxious and does not have insomnia.   -see HPI  Past Medical History:  Past Medical History:  Diagnosis Date  . Preeclampsia    with G1 and G2  . Previous cesarean section   . Vaginal birth after cesarean section 04/05/2017    Past Surgical History: LTCS in 2017 for FITL  Family History:  Family History  Problem Relation Age of Onset  . Brain cancer Mother 2554       Benign  . Ovarian cancer Maternal Grandmother 5063  . Liver cancer Maternal Grandmother   . Breast cancer Paternal Grandmother 3043  . Colon cancer Paternal Grandmother 5350    Social History:  Social History   Socioeconomic History  . Marital status: Single    Spouse name: Not on file  . Number of children: 2  . Years of education: Not on file  . Highest education level: Not on file  Social Needs  . Financial resource strain: Not on file  . Food insecurity - worry: Not on file  . Food insecurity - inability: Not on file  . Transportation needs - medical: Not on file  . Transportation needs - non-medical: Not on file  Occupational History  . Not on file  Tobacco Use  . Smoking status: Former Smoker    Types: Cigarettes    Last attempt to quit: 10/05/2016    Years since quitting: 0.6  . Smokeless tobacco: Never Used  Substance and Sexual Activity  . Alcohol use: No  . Drug use: No  . Sexual activity: Not Currently    Birth control/protection: Injection    Comment: Depo  Other Topics Concern  . Not on file  Social History Narrative  . Not on file    Allergies:  No Known Allergies  Medications: Prior to Admission medications   Medication Sig Start Date End Date Taking? Authorizing Provider  medroxyPROGESTERone (DEPO-PROVERA) 150 MG/ML injection Inject 1 mL (150 mg total) every 3 (three) months into the muscle. 04/06/17 07/05/17 Yes Schuman, Jaquelyn Bitterhristanna R, MD  Prenatal Vit-Fe Fumarate-FA (MULTIVITAMIN-PRENATAL) 27-0.8 MG TABS  tablet Take 1 tablet by mouth daily at 12 noon.   Yes [provider]                And ferrous sulfate 325 mgm daily  Physical Exam Vitals: BP 114/66   Pulse 87   Ht 5\' 4"  (1.626 m)   Wt 123 lb (55.8 kg)   Breastfeeding? No   BMI 21.11 kg/m    Physical Exam  Constitutional: She is oriented to person, place, and time.  Pleasant AAF in NAD  Genitourinary:  Genitourinary Comments: Vulva: well healed perineum Vagina: no masses, normal tone Cervix: closed, NT Uterus: AF, small, mobile, NT Adnexa: no masses, NT  HENT:  Head: Normocephalic and atraumatic.  Neck: Normal range of motion. Neck supple. No tracheal deviation present. No thyromegaly present.  Cardiovascular: Normal rate, regular rhythm and normal heart sounds.  Pulmonary/Chest: Effort normal and breath sounds normal.  Breasts: soft, non tender, no masses, nipples intact  Abdominal: No hernia.  Soft, NT, no hepatomegaly, hirsutism of abdomen present  Musculoskeletal: Normal range of motion. She exhibits no edema.  Lymphadenopathy:    She has no cervical adenopathy.  Neurological: She is alert and oriented to person, place, and time.  Skin: Skin is warm and dry.  Psychiatric: She has a normal mood and affect.   Results for orders placed or performed in visit on 05/18/17 (from the past 24 hour(s))  POCT hemoglobin     Status: Abnormal   Collection Time: 05/18/17  1:49 PM  Result Value Ref Range   Hemoglobin 12.0 (A) 12.2 - 16.2 g/dL   Assessment: 26 y.o. [id number] s/p VBAC Normal 6 week postpartum check Anemia-resolved   Plan:  1. Recommend taking vitamin with iron daily until baby at least 3 months old.  2. Slowly return to normal activity  3. Contraception: patient received first dose of Depo Provera prior to discharge. Will need next dose 30 Jan +/-1 week  4) Annual due after MAy 2019  Farrel Connersolleen Ordean Fouts, [place]

## 2017-06-22 ENCOUNTER — Emergency Department: Payer: BLUE CROSS/BLUE SHIELD

## 2017-06-22 ENCOUNTER — Emergency Department
Admission: EM | Admit: 2017-06-22 | Discharge: 2017-06-22 | Disposition: A | Discharge status: Home / Self Care | Payer: BLUE CROSS/BLUE SHIELD | Attending: Emergency Medicine | Admitting: Emergency Medicine

## 2017-06-22 ENCOUNTER — Other Ambulatory Visit: Payer: Self-pay

## 2017-06-22 ENCOUNTER — Encounter: Payer: Self-pay | Admitting: Emergency Medicine

## 2017-06-22 DIAGNOSIS — B9789 Other viral agents as the cause of diseases classified elsewhere: Secondary | ICD-10-CM | POA: Insufficient documentation

## 2017-06-22 DIAGNOSIS — R05 Cough: Secondary | ICD-10-CM | POA: Diagnosis present

## 2017-06-22 DIAGNOSIS — J069 Acute upper respiratory infection, unspecified: Secondary | ICD-10-CM | POA: Diagnosis not present

## 2017-06-22 DIAGNOSIS — Z87891 Personal history of nicotine dependence: Secondary | ICD-10-CM | POA: Diagnosis not present

## 2017-06-22 DIAGNOSIS — Z79899 Other long term (current) drug therapy: Secondary | ICD-10-CM | POA: Diagnosis not present

## 2017-06-22 MED ORDER — PSEUDOEPH-BROMPHEN-DM 30-2-10 MG/5ML PO SYRP: 5.0000 mL | ORAL_SOLUTION | Freq: Four times a day (QID) | ORAL | 0 refills | Status: DC | PRN

## 2017-06-22 NOTE — ED Provider Notes (Signed)
[facility] Emergency Department Provider Note   ____________________________________________   First MD Initiated Contact with Patient 06/22/17 1150     (approximate)  I have reviewed the triage vital signs and the nursing notes.   HISTORY  Chief Complaint Cough    HPI Cindy Lee is a 27 y.o. female patient complaint of cough and night sweats for 1 week.  Patient state sputum wax and wane between clear and green.  Patient also complain of nasal drainage.  Patient unsure of fever.  Patient denies nausea, vomiting, diarrhea.  No palliative measured for complaint.  Past Medical History:  Diagnosis Date  . Preeclampsia    with G1 and G2  . Previous cesarean section   . Vaginal birth after cesarean section 04/05/2017    Patient Active Problem List   Diagnosis Date Noted  . History of pre-eclampsia 04/05/2017  . Vaginal birth after cesarean section 04/05/2017  . Previous cesarean section   . History of cesarean delivery 03/07/2017  . History of gestational hypertension 10/15/2016  . Postpartum care following vaginal delivery 09/15/2015    Past Surgical History:  Procedure Laterality Date  . CESAREAN SECTION N/A 09/12/2015   Procedure: CESAREAN SECTION;  Surgeon: Conard Novak, MD;  Location: [facility] ORS;  Service: Obstetrics;  Laterality: N/A;    Prior to Admission medications   Medication Sig Start Date End Date Taking? Authorizing Provider  brompheniramine-pseudoephedrine-DM 30-2-10 MG/5ML syrup Take 5 mLs by mouth 4 (four) times daily as needed. 06/22/17   Joni Reining, PA-C  ferrous sulfate 325 (65 FE) MG tablet Take 325 mg daily with breakfast by mouth.    [provider]  ibuprofen (ADVIL,MOTRIN) 600 MG tablet Take 1 tablet (600 mg total) every 6 (six) hours by mouth. Patient not taking: Reported on 04/13/2017 04/06/17   Natale Milch, MD  medroxyPROGESTERone (DEPO-PROVERA) 150 MG/ML injection Inject 1 mL (150 mg  total) every 3 (three) months into the muscle. 04/06/17 07/05/17  Natale Milch, MD  Prenatal Vit-Fe Fumarate-FA (MULTIVITAMIN-PRENATAL) 27-0.8 MG TABS tablet Take 1 tablet by mouth daily at 12 noon.    [provider]    Allergies Patient has no known allergies.  Family History  Problem Relation Age of Onset  . Brain cancer Mother 71       Benign  . Ovarian cancer Maternal Grandmother 81  . Liver cancer Maternal Grandmother   . Breast cancer Paternal Grandmother 32  . Colon cancer Paternal Grandmother 61    Social History Social History   Tobacco Use  . Smoking status: Former Smoker    Types: Cigarettes    Last attempt to quit: 10/05/2016    Years since quitting: 0.7  . Smokeless tobacco: Never Used  Substance Use Topics  . Alcohol use: No  . Drug use: No    Review of Systems Constitutional: No fever/chills.  Night sweats Eyes: No visual changes. ENT: No sore throat.  Nasal congestion Cardiovascular: Denies chest pain. Respiratory: Denies shortness of breath.  Productive cough Gastrointestinal: No abdominal pain.  No nausea, no vomiting.  No diarrhea.  No constipation. Genitourinary: Negative for dysuria. Musculoskeletal: Negative for back pain. Skin: Negative for rash. Neurological: Negative for headaches, focal weakness or numbness.   ____________________________________________   PHYSICAL EXAM:  VITAL SIGNS: ED Triage Vitals  Enc Vitals Group     BP 06/22/17 1130 134/80     Pulse Rate 06/22/17 1130 96     Resp 06/22/17 1130 15  Temp 06/22/17 1130 98.3 F (36.8 C)     Temp Source 06/22/17 1130 Oral     SpO2 06/22/17 1130 100 %     Weight 06/22/17 1131 120 lb (54.4 kg)     Height 06/22/17 1131 5\' 4"  (1.626 m)     Head Circumference --      Peak Flow --      Pain Score --      Pain Loc --      Pain Edu? --      Excl. in GC? --    Constitutional: Alert and oriented. Well appearing and in no acute distress. Nose:  nasal  congestion Mouth/Throat: Mucous membranes are moist.  Oropharynx erythematous. Neck: No stridor.   Cardiovascular: Normal rate, regular rhythm. Grossly normal heart sounds.  Good peripheral circulation. Respiratory: Normal respiratory effort.  No retractions. Lungs CTAB. Neurologic:  Normal speech and language. No gross focal neurologic deficits are appreciated. No gait instability. Skin:  Skin is warm, dry and intact. No rash noted. Psychiatric: Mood and affect are normal. Speech and behavior are normal.  ____________________________________________   LABS (all labs ordered are listed, but only abnormal results are displayed)  Labs Reviewed - No data to display ____________________________________________  EKG   ____________________________________________  RADIOLOGY  Dg Chest 2 View  Result Date: 06/22/2017 CLINICAL DATA:  Dry cough for 1 week, night sweats, runny nose EXAM: CHEST  2 VIEW COMPARISON:  None FINDINGS: Normal heart size, mediastinal contours, and pulmonary vascularity. Lungs well expanded and clear. No pleural effusion or pneumothorax. Bones unremarkable. IMPRESSION: Normal exam. Electronically Signed   By: Ulyses SouthwardMark  Boles M.D.   On: 06/22/2017 12:46    ____________________________________________   PROCEDURES  Procedure(s) performed: None  Procedures  Critical Care performed: No  ____________________________________________   INITIAL IMPRESSION / ASSESSMENT AND PLAN / ED COURSE  As part of my medical decision making, I reviewed the following data within the electronic MEDICAL RECORD NUMBER   Cough and congestion secondary to viral respiratory infection.  Patient complained of night sweats [facility]  Patient given discharge care instructions and a prescription for Bromfed-DM.       ____________________________________________   FINAL CLINICAL IMPRESSION(S) / ED DIAGNOSES  Final diagnoses:  Viral URI  with cough     ED Discharge Orders        Ordered    brompheniramine-pseudoephedrine-DM 30-2-10 MG/5ML syrup  4 times daily PRN     06/22/17 1254       Note:  This document was prepared using Dragon voice recognition software and may include unintentional dictation errors.    Joni ReiningSmith, Ronald K, PA-C 06/22/17 1258    Rockne MenghiniNorman, Anne-Caroline, MD 06/22/17 [phone]

## 2017-06-22 NOTE — ED Notes (Signed)
See triage note  Presents with cough which started about 2 weeks ago   States cough has been prod since the beginning  Unsure of fever but has had some sweats at night

## 2017-06-22 NOTE — Discharge Instructions (Signed)
Follow-up with health department as directed.

## 2017-06-22 NOTE — ED Triage Notes (Signed)
Cough X 1 week.  Just had baby recently so wants to make sure not contagious.  Has had some sweats worse at night. Sputum is clear and green.  Nasal drainage posterior as well.  No known fevers.  Afebrile here. Ambulatory. Unlabored.

## 2017-07-07 ENCOUNTER — Ambulatory Visit: Payer: BLUE CROSS/BLUE SHIELD

## 2017-08-10 ENCOUNTER — Ambulatory Visit (INDEPENDENT_AMBULATORY_CARE_PROVIDER_SITE_OTHER): Payer: BLUE CROSS/BLUE SHIELD

## 2017-08-10 DIAGNOSIS — Z3042 Encounter for surveillance of injectable contraceptive: Secondary | ICD-10-CM | POA: Diagnosis not present

## 2017-08-10 DIAGNOSIS — N912 Amenorrhea, unspecified: Secondary | ICD-10-CM | POA: Diagnosis not present

## 2017-08-10 LAB — POCT URINE PREGNANCY: Preg Test, Ur: NEGATIVE

## 2017-08-10 MED ORDER — MEDROXYPROGESTERONE ACETATE 150 MG/ML IM SUSP
150.0000 mg | Freq: Once | INTRAMUSCULAR | Status: AC
  Administered 2017-08-10: 150 mg via INTRAMUSCULAR

## 2017-08-10 NOTE — Progress Notes (Signed)
Pt no showed her 07/07/17 Depo apt. Late for administration. Pregnancy test performed-Negative.

## 2017-09-28 ENCOUNTER — Ambulatory Visit: Payer: BLUE CROSS/BLUE SHIELD | Admitting: Certified Nurse Midwife

## 2017-10-10 ENCOUNTER — Ambulatory Visit: Payer: BLUE CROSS/BLUE SHIELD | Admitting: Certified Nurse Midwife

## 2017-10-26 DIAGNOSIS — Z5321 Procedure and treatment not carried out due to patient leaving prior to being seen by health care provider: Secondary | ICD-10-CM | POA: Diagnosis not present

## 2017-10-26 DIAGNOSIS — R600 Localized edema: Secondary | ICD-10-CM | POA: Diagnosis not present

## 2017-10-27 ENCOUNTER — Encounter: Payer: Self-pay | Admitting: Emergency Medicine

## 2017-10-27 ENCOUNTER — Other Ambulatory Visit: Payer: Self-pay

## 2017-10-27 ENCOUNTER — Emergency Department
Admission: EM | Admit: 2017-10-27 | Discharge: 2017-10-27 | Disposition: A | Discharge status: Home / Self Care | Payer: BLUE CROSS/BLUE SHIELD | Attending: Emergency Medicine | Admitting: Emergency Medicine

## 2017-10-27 NOTE — ED Notes (Signed)
No answer when called from lobby x2. 

## 2017-10-27 NOTE — ED Triage Notes (Signed)
Patient to ER for c/o swelling to right cheek. Patient states at approx 2300, she walked through spider web when letting her dog outside. Patient states she went back in and noticed face was swollen to right cheek. States she did not feel a bite, denies any itching. Patient has very mild swelling to right cheek. Denies any difficulty breathing.

## 2017-10-27 NOTE — ED Notes (Signed)
No answer when called from lobby x1 

## 2017-10-27 NOTE — ED Notes (Signed)
No answer when called from lobby x3. Pt to be disposed as LWBS after Triage at this time. 

## 2017-10-28 ENCOUNTER — Encounter: Payer: Self-pay | Admitting: Certified Nurse Midwife

## 2017-10-28 ENCOUNTER — Ambulatory Visit (INDEPENDENT_AMBULATORY_CARE_PROVIDER_SITE_OTHER): Payer: BLUE CROSS/BLUE SHIELD | Admitting: Certified Nurse Midwife

## 2017-10-28 VITALS — BP 102/62 | HR 79 | Ht 64.0 in | Wt 111.0 lb

## 2017-10-28 DIAGNOSIS — N939 Abnormal uterine and vaginal bleeding, unspecified: Secondary | ICD-10-CM

## 2017-10-28 DIAGNOSIS — Z113 Encounter for screening for infections with a predominantly sexual mode of transmission: Secondary | ICD-10-CM | POA: Diagnosis not present

## 2017-10-28 DIAGNOSIS — R350 Frequency of micturition: Secondary | ICD-10-CM

## 2017-10-28 DIAGNOSIS — N760 Acute vaginitis: Secondary | ICD-10-CM | POA: Diagnosis not present

## 2017-10-28 DIAGNOSIS — R6889 Other general symptoms and signs: Secondary | ICD-10-CM

## 2017-10-28 DIAGNOSIS — B9689 Other specified bacterial agents as the cause of diseases classified elsewhere: Secondary | ICD-10-CM

## 2017-10-28 DIAGNOSIS — N898 Other specified noninflammatory disorders of vagina: Secondary | ICD-10-CM | POA: Diagnosis not present

## 2017-10-28 DIAGNOSIS — N941 Unspecified dyspareunia: Secondary | ICD-10-CM | POA: Diagnosis not present

## 2017-10-28 DIAGNOSIS — Z124 Encounter for screening for malignant neoplasm of cervix: Secondary | ICD-10-CM

## 2017-10-28 LAB — POCT URINALYSIS DIPSTICK
Bilirubin, UA: NEGATIVE
Glucose, UA: NEGATIVE
Ketones, UA: NEGATIVE
Nitrite, UA: NEGATIVE
Protein, UA: POSITIVE — AB
Spec Grav, UA: 1.025 (ref 1.010–1.025)
Urobilinogen, UA: 0.2 E.U./dL
pH, UA: 6 (ref 5.0–8.0)

## 2017-10-28 LAB — POCT URINE PREGNANCY: Preg Test, Ur: NEGATIVE

## 2017-10-28 LAB — POCT WET PREP (WET MOUNT): Trichomonas Wet Prep HPF POC: ABSENT

## 2017-10-28 MED ORDER — TINIDAZOLE 500 MG PO TABS: 1000.0000 mg | ORAL_TABLET | Freq: Every day | ORAL | 0 refills | Status: AC

## 2017-10-28 MED ORDER — NORETHIN-ETH ESTRAD-FE BIPHAS 1 MG-10 MCG / 10 MCG PO TABS: 1.0000 | ORAL_TABLET | Freq: Every day | ORAL | 3 refills | Status: DC

## 2017-10-28 NOTE — Progress Notes (Addendum)
Gynecology Annual Exam  PCP: Patient, No Pcp Per  Chief Complaint:  Chief Complaint  Patient presents with  . Follow-up    medication follow up - depo provera    History of Present Illness: Cindy Lee is a 27 y.o. [id number] who presents with multiple concerns. She has been on the Depo Provera injections after her vaginal delivery/ VBAC and received her first dose 04/06/2017. She was due for her next injection 07/07/17 but did not get the second dose until 08/10/2017. She started bleeding April 14 moderately heavy, needing to change her pads every 1.5 to 2 hours until 26 May when her bleeding became spotting. Her bleeding has been accompanied by lower abdominal cramping and lower back pain. Her next Depo is due June 5, but she does not want to continue the Depo. She has felt "hot", nauseous,  and sometimes lightheaded with all the bleeding she has been doing. She desires to switch to the pill. Has been on the pill in the past without problems. Is a nonsmoker. No history of thromboembolic disease.   She also has been having problems with dyspareunia that occurs  several minutes into intercourse. The pain is sharp. She denies vulvar itching or irritation. Does not think it is from lack of lubrication. The pain has been present since trying to resume sexual relations after her last delivery.   ROS is also positive for loose stools 2-3 times a day, usually following a meal. Mucous is mixed in with the stool. No blood has been seen. This has been happening for the last few weeks.  She has also had urinary frequency but no dysuria or bad odor to the urine.    Review of Systems: Review of Systems  Constitutional: Positive for weight loss (of 9# since 06/22/17). Negative for fever.  HENT: Positive for sore throat (about 1-2 months ago for a few days).   Eyes: Positive for redness (about 2 months ago with rhinorrhea).  Respiratory: Positive for cough (with rhinorrhea and itchy, watery eyes  about 2 months ago). Negative for wheezing.   Cardiovascular: Negative for chest pain and palpitations.  Gastrointestinal: Positive for abdominal pain, diarrhea (with 2-3 loose stools a day with mucous) and nausea. Negative for blood in stool, constipation and vomiting.  Genitourinary: Positive for frequency. Negative for dysuria and hematuria.       Positive for dyspareunia  Musculoskeletal: Positive for back pain.  Skin: Positive for rash. Negative for itching.  Neurological: Positive for headaches.  Endo/Heme/Allergies: Positive for environmental allergies.       Positive for hirsutism and heat intolerance    Past Medical History:  Past Medical History:  Diagnosis Date  . Preeclampsia    with G1 and G2  . Previous cesarean section   . Vaginal birth after cesarean section 04/05/2017    Past Surgical History:  Past Surgical History:  Procedure Laterality Date  . CESAREAN SECTION N/A 09/12/2015   Procedure: CESAREAN SECTION;  Surgeon: Conard Novak, MD;  Location: [facility] ORS;  Service: Obstetrics;  Laterality: N/A;    Family History:  Family History  Problem Relation Age of Onset  . Brain cancer Mother 14       Benign  . Ovarian cancer Maternal Grandmother 24  . Liver cancer Maternal Grandmother   . Breast cancer Paternal Grandmother 88  . Colon cancer Paternal Grandmother 23    Social History:  Social History   Socioeconomic History  . Marital status: Single  Spouse name: Not on file  . Number of children: 2  . Years of education: Not on file  . Highest education level: Not on file  Occupational History  . Not on file  Social Needs  . Financial resource strain: Not on file  . Food insecurity:    Worry: Not on file    Inability: Not on file  . Transportation needs:    Medical: Not on file    Non-medical: Not on file  Tobacco Use  . Smoking status: Former Smoker    Types: Cigarettes    Last attempt to quit: 10/05/2016    Years since quitting: 1.0  .  Smokeless tobacco: Never Used  Substance and Sexual Activity  . Alcohol use: No  . Drug use: No  . Sexual activity: Yes    Birth control/protection: Injection    Comment: Depo  Lifestyle  . Physical activity:    Days per week: 0 days    Minutes per session: 0 min  . Stress: Not at all  Relationships  . Social connections:    Talks on phone: More than three times a week    Gets together: Three times a week    Attends religious service: More than 4 times per year    Active member of club or organization: No    Attends meetings of clubs or organizations: Never    Relationship status: Never married  . Intimate partner violence:    Fear of current or ex partner: No    Emotionally abused: No    Physically abused: No    Forced sexual activity: No  Other Topics Concern  . Not on file  Social History Narrative  . Not on file    Allergies:  No Known Allergies  Medications: Prior to Admission medications   Medication Sig Start Date End Date Taking? Authorizing Provider  ferrous sulfate 325 (65 FE) MG tablet Take 325 mg daily with breakfast by mouth.   Yes [provider]  Multiple Vitamin (MULTIVITAMIN) tablet Take 1 tablet by mouth daily.   Yes [provider]  medroxyPROGESTERone (DEPO-PROVERA) 150 MG/ML injection Inject 1 mL (150 mg total) every 3 (three) months into the muscle. 04/06/17 07/05/17  Natale Milch, MD       Farrel Conners, CNM       Farrel Conners, CNM    Physical Exam Vitals: Blood pressure 102/62, pulse 79, height  (1.626 m), weight 111 lb (50.3 kg), last menstrual period 09/11/2017, not currently breastfeeding.  General: BF in NAD HEENT: normocephalic, anicteric Neck: no thyroid enlargement, no palpable nodules, no cervical lymphadenopathy  Pulmonary: No increased work of breathing, CTAB Cardiovascular: RRR, without murmur  Abdomen: Soft, tender to palpation on left side, non-distended.  Umbilicus without lesions.  No  hepatomegaly or masses palpable. No evidence of hernia. Genitourinary:  External: Normal external female genitalia.  Normal urethral meatus, normal  Bartholin's and Skene's glands.    Vagina: Normal vaginal mucosa, no evidence of prolapse, tender during digital exam when passing beyond hymen, white homogenous discharge    Cervix: Grossly normal in appearance, no bleeding, tender with Pap  Uterus: Retroflexed, difficulty evaluating shape and size due to position, fixed, and non-tender  Adnexa: No adnexal masses, non-tender  Rectal: deferred  Lymphatic: no evidence of inguinal lymphadenopathy Extremities: no edema, erythema, or tenderness Neurologic: Grossly intact Psychiatric: mood appropriate, affect full Skin: female pattern hair growth on abdomen and on legs  Results for orders placed or performed in  visit on 10/28/17 (from the past 24 hour(s))  POCT urine pregnancy     Status: Normal   Collection Time: 10/28/17 11:42 AM  Result Value Ref Range   Preg Test, Ur Negative Negative  POCT Urinalysis Dipstick     Status: Abnormal   Collection Time: 10/28/17 12:20 PM  Result Value Ref Range   Color, UA golden yellow    Clarity, UA hazy    Glucose, UA Negative Negative   Bilirubin, UA negative    Ketones, UA negative    Spec Grav, UA 1.025 1.010 - 1.025   Blood, UA moderate    pH, UA 6.0 5.0 - 8.0   Protein, UA Positive (A) Negative   Urobilinogen, UA 0.2 0.2 or 1.0 E.U./dL   Nitrite, UA negative    Leukocytes, UA Moderate (2+) (A) Negative   Appearance     Odor    POCT Wet Prep [place])     Status: Abnormal   Collection Time: 10/28/17  1:33 PM  Result Value Ref Range   Source Wet Prep POC vaginal    WBC, Wet Prep HPF POC     Bacteria Wet Prep HPF POC  Few   BACTERIA WET PREP MORPHOLOGY POC     Clue Cells Wet Prep HPF POC Many (A) None   Clue Cells Wet Prep Whiff POC     Yeast Wet Prep HPF POC None None   KOH Wet Prep POC  None   Trichomonas Wet Prep HPF POC Absent Absent      Assessment/ Plan: 27 y.o. [id number] with AUB, probably due to Depo Provera, however need to rule out thyroid disease-TSH today  Desires to start OCPs-will start with Lo Loestrin today  Heat intolerance/ lightheadedness-CBC  Dyspareunia: maybe due to pelvic muscle pain since her vaginal delivery, but does have BV. RX Tindamax m x 5 days with food, no alcohol. Paptima for cervical cancer and STD screen If pain with IC persists will refer to PT for pelvic floor therapy  Loose stools with mucous-if persists will refer to GI for further evaluation of inflammatory bowel disease.  Will call with lab results. Patient to call me if dyspareunia persists  Follow up on pills/AUB in 2 months. \ Farrel Conners, CNM

## 2017-10-29 LAB — CBC WITH DIFFERENTIAL/PLATELET
Basophils Absolute: 0 10*3/uL (ref 0.0–0.2)
Basos: 1 %
EOS (ABSOLUTE): 0.1 10*3/uL (ref 0.0–0.4)
Eos: 2 %
Hematocrit: 38.5 % (ref 34.0–46.6)
Hemoglobin: 12.8 g/dL (ref 11.1–15.9)
Immature Grans (Abs): 0 10*3/uL (ref 0.0–0.1)
Immature Granulocytes: 0 %
Lymphocytes Absolute: 2.1 10*3/uL (ref 0.7–3.1)
Lymphs: 55 %
MCH: 29.5 pg (ref 26.6–33.0)
MCHC: 33.2 g/dL (ref 31.5–35.7)
MCV: 89 fL (ref 79–97)
Monocytes Absolute: 0.4 10*3/uL (ref 0.1–0.9)
Monocytes: 11 %
Neutrophils Absolute: 1.2 10*3/uL — ABNORMAL LOW (ref 1.4–7.0)
Neutrophils: 31 %
Platelets: 293 10*3/uL (ref 150–450)
RBC: 4.34 x10E6/uL (ref 3.77–5.28)
RDW: 14 % (ref 12.3–15.4)
WBC: 3.8 10*3/uL (ref 3.4–10.8)

## 2017-10-29 LAB — TSH: TSH: 2.92 u[IU]/mL (ref 0.450–4.500)

## 2017-10-30 LAB — URINE CULTURE

## 2017-11-01 LAB — PAP IG, CT-NG, RFX HPV ALL
Chlamydia, Nuc. Acid Amp: NEGATIVE
Gonococcus by Nucleic Acid Amp: NEGATIVE
PAP Smear Comment: 0

## 2017-11-02 ENCOUNTER — Ambulatory Visit: Payer: BLUE CROSS/BLUE SHIELD

## 2017-11-07 ENCOUNTER — Encounter: Payer: Self-pay | Admitting: Certified Nurse Midwife

## 2017-11-07 ENCOUNTER — Other Ambulatory Visit: Payer: Self-pay | Admitting: Certified Nurse Midwife

## 2017-11-07 DIAGNOSIS — N941 Unspecified dyspareunia: Secondary | ICD-10-CM

## 2017-11-07 NOTE — Progress Notes (Signed)
Called patient with lab and Pap results-all essentially normal. Continues to have dyspareunia. Will refer to PT for pelvic floor therapy. Farrel Connersolleen Boyd Litaker, CNM

## 2017-11-29 ENCOUNTER — Ambulatory Visit: Payer: BLUE CROSS/BLUE SHIELD | Attending: Certified Nurse Midwife | Admitting: Physical Therapy

## 2017-12-06 ENCOUNTER — Ambulatory Visit: Payer: BLUE CROSS/BLUE SHIELD | Admitting: Physical Therapy

## 2017-12-13 ENCOUNTER — Encounter: Payer: BLUE CROSS/BLUE SHIELD | Admitting: Physical Therapy

## 2017-12-20 ENCOUNTER — Encounter: Payer: BLUE CROSS/BLUE SHIELD | Admitting: Physical Therapy

## 2018-01-03 ENCOUNTER — Encounter: Payer: BLUE CROSS/BLUE SHIELD | Admitting: Physical Therapy

## 2018-01-17 ENCOUNTER — Encounter: Payer: BLUE CROSS/BLUE SHIELD | Admitting: Physical Therapy

## 2018-01-31 ENCOUNTER — Encounter: Payer: BLUE CROSS/BLUE SHIELD | Admitting: Physical Therapy

## 2018-06-27 ENCOUNTER — Ambulatory Visit (INDEPENDENT_AMBULATORY_CARE_PROVIDER_SITE_OTHER): Payer: BLUE CROSS/BLUE SHIELD | Admitting: Obstetrics and Gynecology

## 2018-06-27 ENCOUNTER — Encounter: Payer: Self-pay | Admitting: Obstetrics and Gynecology

## 2018-06-27 VITALS — BP 120/82 | HR 108 | Ht 64.0 in | Wt 118.0 lb

## 2018-06-27 DIAGNOSIS — B3731 Acute candidiasis of vulva and vagina: Secondary | ICD-10-CM

## 2018-06-27 DIAGNOSIS — Z3041 Encounter for surveillance of contraceptive pills: Secondary | ICD-10-CM

## 2018-06-27 DIAGNOSIS — B373 Candidiasis of vulva and vagina: Secondary | ICD-10-CM | POA: Diagnosis not present

## 2018-06-27 LAB — POCT WET PREP WITH KOH
Clue Cells Wet Prep HPF POC: NEGATIVE
KOH Prep POC: NEGATIVE
Trichomonas, UA: NEGATIVE
Yeast Wet Prep HPF POC: NEGATIVE

## 2018-06-27 MED ORDER — NORETHIN-ETH ESTRAD-FE BIPHAS 1 MG-10 MCG / 10 MCG PO TABS: 1.0000 | ORAL_TABLET | Freq: Every day | ORAL | 0 refills | Status: DC

## 2018-06-27 MED ORDER — TERCONAZOLE 0.4 % VA CREA: 1.0000 | TOPICAL_CREAM | Freq: Every day | VAGINAL | 0 refills | Status: DC

## 2018-06-27 NOTE — Patient Instructions (Signed)
I value your feedback and entrusting us with your care. If you get a [facility] patient survey, I would appreciate you taking the time to let us know about your experience today. Thank you! 

## 2018-06-27 NOTE — Progress Notes (Signed)
Patient, No Pcp Per   Chief Complaint  Patient presents with  . Vaginitis    irritation, little discharge with no odor, no itchiness started last night    HPI:      [name] is a 28 y.o. [id number] who LMP was Patient's last menstrual period was 06/09/2018 (exact date)., presents today for increased d/c, irritation with vaginal swelling, no odor since last night. No recent abx use, no meds to treat. Hx of yeast vag in past. No new soaps, but has used scented toilet paper. No new sex partners.  Annual due and needs Rx RF on OCPs. Plans to sched today.  Past Medical History:  Diagnosis Date  . Family history of breast cancer   . Family history of ovarian cancer   . Preeclampsia    with G1 and G2  . Previous cesarean section   . Vaginal birth after cesarean section 04/05/2017    Past Surgical History:  Procedure Laterality Date  . CESAREAN SECTION N/A 09/12/2015   Procedure: CESAREAN SECTION;  Surgeon: Conard NovakStephen D Jackson, MD;  Location: [facility] ORS;  Service: Obstetrics;  Laterality: N/A;    Family History  Problem Relation Age of Onset  . Brain cancer Mother [id number]       Benign  . Ovarian cancer Maternal Grandmother 5963  . Liver cancer Maternal Grandmother   . Breast cancer Paternal Grandmother 8443  . Colon cancer Paternal Grandmother 6250    Social History   Socioeconomic History  . Marital status: Single    Spouse name: Not on file  . Number of children: 2  . Years of education: Not on file  . Highest education level: Not on file  Occupational History  . Not on file  Social Needs  . Financial resource strain: Not on file  . Food insecurity:    Worry: Not on file    Inability: Not on file  . Transportation needs:    Medical: Not on file    Non-medical: Not on file  Tobacco Use  . Smoking status: Former Smoker    Types: Cigarettes    Last attempt to quit: 10/05/2016    Years since quitting: 1.7  . Smokeless tobacco: Never Used  Substance and Sexual  Activity  . Alcohol use: No  . Drug use: No  . Sexual activity: Yes    Birth control/protection: Condom  Lifestyle  . Physical activity:    Days per week: 0 days    Minutes per session: 0 min  . Stress: Not at all  Relationships  . Social connections:    Talks on phone: More than three times a week    Gets together: Three times a week    Attends religious service: More than 4 times per year    Active member of club or organization: No    Attends meetings of clubs or organizations: Never    Relationship status: Never married  . Intimate partner violence:    Fear of current or ex partner: No    Emotionally abused: No    Physically abused: No    Forced sexual activity: No  Other Topics Concern  . Not on file  Social History Narrative  . Not on file    Outpatient Medications Prior to Visit  Medication Sig Dispense Refill  . Multiple Vitamin (MULTIVITAMIN) tablet Take 1 tablet by mouth daily.    . ferrous sulfate 325 (65 FE) MG tablet Take 325 mg daily with breakfast by  mouth.    . medroxyPROGESTERone (DEPO-PROVERA) 150 MG/ML injection Inject 1 mL (150 mg total) every 3 (three) months into the muscle. 1 mL 3  . Norethindrone-Ethinyl Estradiol-Fe Biphas (LO LOESTRIN FE) 1 MG-10 MCG / 10 MCG tablet Take 1 tablet by mouth daily. (Patient not taking: Reported on 06/27/2018) 84 tablet 3   No facility-administered medications prior to visit.      ROS:  Review of Systems  Constitutional: Negative for fever.  Gastrointestinal: Negative for blood in stool, constipation, diarrhea, nausea and vomiting.  Genitourinary: Positive for vaginal discharge. Negative for dyspareunia, dysuria, flank pain, frequency, hematuria, urgency, vaginal bleeding and vaginal pain.  Musculoskeletal: Negative for back pain.  Skin: Negative for rash.   BREAST: No symptoms   OBJECTIVE:   Vitals:  BP 120/82   Pulse (!) 108   Ht 5\' 4"  (1.626 m)   Wt 118 lb (53.5 kg)   LMP 06/09/2018 (Exact Date)    Breastfeeding No   BMI 20.25 kg/m   Physical Exam Vitals signs reviewed.  Constitutional:      Appearance: She is well-developed.  Pulmonary:     Effort: Pulmonary effort is normal.  Genitourinary:    Pubic Area: No rash.      Labia:        Right: Tenderness present. No rash or lesion.        Left: Tenderness present. No rash or lesion.      Vagina: Vaginal discharge present. No erythema or tenderness.     Cervix: Normal.     Uterus: Normal. Not enlarged and not tender.      Adnexa: Right adnexa normal and left adnexa normal.       Right: No mass or tenderness.         Left: No mass or tenderness.       Comments: BILAT LABIA MINORA WITH ERYTHEMA, SWELLING, WHITE VAG D/C Musculoskeletal: Normal range of motion.  Neurological:     Mental Status: She is alert and oriented to person, place, and time.  Psychiatric:        Behavior: Behavior normal.        Thought Content: Thought content normal.     Results: Results for orders placed or performed in visit on 06/27/18 (from the past 24 hour(s))  POCT Wet Prep with KOH     Status: Normal   Collection Time: 06/27/18  2:23 PM  Result Value Ref Range   Trichomonas, UA Negative    Clue Cells Wet Prep HPF POC neg    Epithelial Wet Prep HPF POC     Yeast Wet Prep HPF POC neg    Bacteria Wet Prep HPF POC     RBC Wet Prep HPF POC     WBC Wet Prep HPF POC     KOH Prep POC Negative Negative     Assessment/Plan: Candidal vaginitis - Pos sx/exam, neg wet prep. Treat empirically. Rx terazol-7 crm. F/u prn.  - Plan: terconazole (TERAZOL 7) 0.4 % vaginal cream, POCT Wet Prep with KOH  Encounter for surveillance of contraceptive pills - OCP RF till annual appt 2/20. - Plan: Norethindrone-Ethinyl Estradiol-Fe Biphas (LO LOESTRIN FE) 1 MG-10 MCG / 10 MCG tablet    Meds ordered this encounter  Medications  . terconazole (TERAZOL 7) 0.4 % vaginal cream    Sig: Place 1 applicator vaginally at bedtime.    Dispense:  45 g    Refill:  0      Order Specific Question:  Supervising Provider    Answer:   Nadara Mustard [[id number]]  . Norethindrone-Ethinyl Estradiol-Fe Biphas (LO LOESTRIN FE) 1 MG-10 MCG / 10 MCG tablet    Sig: Take 1 tablet by mouth daily.    Dispense:  84 tablet    Refill:  0    Order Specific Question:   Supervising Provider    Answer:   Nadara Mustard [[id number]]      Return if symptoms worsen or fail to improve./annual due  Challen Spainhour B. Loyalty Arentz, PA-C 06/27/2018 2:24 PM

## 2018-07-17 ENCOUNTER — Ambulatory Visit: Payer: BLUE CROSS/BLUE SHIELD | Admitting: Obstetrics and Gynecology

## 2018-07-26 ENCOUNTER — Ambulatory Visit: Payer: BLUE CROSS/BLUE SHIELD | Admitting: Obstetrics and Gynecology

## 2018-07-26 NOTE — Progress Notes (Deleted)
PCP:  Patient, No Pcp Per   No chief complaint on file.    HPI:      [name] is a 28 y.o. [phone] who LMP was No LMP recorded., presents today for her annual examination.  Her menses are {norm/abn:715}, lasting {number:22536} days.  Dysmenorrhea {dysmen:716}. She {does:18564} have intermenstrual bleeding.  Sex activity: single partner, contraception - OCP (estrogen/progesterone).  Last Pap: Oct 28, 2017  Results were: no abnormalities  Hx of STDs: {STD hx:14358} Yeast vag sx 1/20***  There is a FH of breast cancer in her PGM, genetic testing not done. There is a FH of ovarian cancer in her MGM, genetic testing not done. The patient {does:18564} do self-breast exams.  Tobacco use: {tob:20664} Alcohol use: {Alcohol:11675} No drug use.  Exercise: {exercise:31265}  She {does:18564} get adequate calcium and Vitamin D in her diet.   Past Medical History:  Diagnosis Date  . Family history of breast cancer   . Family history of ovarian cancer   . Preeclampsia    with G1 and G2  . Previous cesarean section   . Vaginal birth after cesarean section 04/05/2017    Past Surgical History:  Procedure Laterality Date  . CESAREAN SECTION N/A 09/12/2015   Procedure: CESAREAN SECTION;  Surgeon: Conard Novak, MD;  Location: [facility] ORS;  Service: Obstetrics;  Laterality: N/A;    Family History  Problem Relation Age of Onset  . Brain cancer Mother 70       Benign  . Ovarian cancer Maternal Grandmother 54  . Liver cancer Maternal Grandmother   . Breast cancer Paternal Grandmother 23  . Colon cancer Paternal Grandmother 13    Social History   Socioeconomic History  . Marital status: Single    Spouse name: Not on file  . Number of children: 2  . Years of education: Not on file  . Highest education level: Not on file  Occupational History  . Not on file  Social Needs  . Financial resource strain: Not on file  . Food insecurity:    Worry: Not on file   Inability: Not on file  . Transportation needs:    Medical: Not on file    Non-medical: Not on file  Tobacco Use  . Smoking status: Former Smoker    Types: Cigarettes    Last attempt to quit: 10/05/2016    Years since quitting: 1.8  . Smokeless tobacco: Never Used  Substance and Sexual Activity  . Alcohol use: No  . Drug use: No  . Sexual activity: Yes    Birth control/protection: Condom  Lifestyle  . Physical activity:    Days per week: 0 days    Minutes per session: 0 min  . Stress: Not at all  Relationships  . Social connections:    Talks on phone: More than three times a week    Gets together: Three times a week    Attends religious service: More than 4 times per year    Active member of club or organization: No    Attends meetings of clubs or organizations: Never    Relationship status: Never married  . Intimate partner violence:    Fear of current or ex partner: No    Emotionally abused: No    Physically abused: No    Forced sexual activity: No  Other Topics Concern  . Not on file  Social History Narrative  . Not on file    Outpatient Medications Prior to Visit  Medication Sig Dispense Refill  . Multiple Vitamin (MULTIVITAMIN) tablet Take 1 tablet by mouth daily.    . Norethindrone-Ethinyl Estradiol-Fe Biphas (LO LOESTRIN FE) 1 MG-10 MCG / 10 MCG tablet Take 1 tablet by mouth daily. 84 tablet 0  . terconazole (TERAZOL 7) 0.4 % vaginal cream Place 1 applicator vaginally at bedtime. 45 g 0   No facility-administered medications prior to visit.       ROS:  Review of Systems BREAST: No symptoms   Objective: There were no vitals taken for this visit.   OBGyn Exam  Results: No results found for this or any previous visit (from the past 24 hour(s)).  Assessment/Plan: No diagnosis found.  No orders of the defined types were placed in this encounter.            GYN counsel {counseling:16159}     F/U  No follow-ups on file.  Anasophia Pecor B. Kelisha Dall,  PA-C 07/26/2018 10:46 AM

## 2018-12-18 NOTE — Progress Notes (Signed)
PCP:  Patient, No Pcp Per   Chief Complaint  Patient presents with  . Gynecologic Exam  . Contraception    wants BC pills     HPI:      [name] is a 28 y.o. [id number] who LMP was Patient's last menstrual period was 11/20/2018 (exact date)., presents today for her annual examination.  Her menses are regular every 28-30 days, lasting 3-7 days.  Dysmenorrhea mild, occurring first 1-2 days of flow. She does not have intermenstrual bleeding.  Sex activity: single partner, contraception - condoms. Would like OCPs. Got Lo Loestrin RF 1/20 but too expensive, so not taking them. Last Pap: 10/28/17 Results were: no abnormalities  Hx of STDs: none  There is a FH of breast cancer in her PGM, genetic testing not done. There is a FH of ovarian cancer in her MGM, genetic testing not done. The patient does not do self-breast exams.  Tobacco use: 1 cig daily, trying to quit. Alcohol use: none No drug use.  Exercise: moderately active  She does get adequate calcium and Vitamin D in her diet.   Past Medical History:  Diagnosis Date  . Family history of breast cancer   . Family history of ovarian cancer   . Preeclampsia    with G1 and G2  . Previous cesarean section   . Vaginal birth after cesarean section 04/05/2017    Past Surgical History:  Procedure Laterality Date  . CESAREAN SECTION N/A 09/12/2015   Procedure: CESAREAN SECTION;  Surgeon: Conard NovakStephen D Jackson, MD;  Location: [facility] ORS;  Service: Obstetrics;  Laterality: N/A;    Family History  Problem Relation Age of Onset  . Other Mother 2354       Benign brain tumor  . Ovarian cancer Maternal Grandmother [phone]  . Liver cancer Maternal Grandmother   . Breast cancer Paternal Grandmother [phone]  . Colon cancer Paternal Grandmother 3250    Social History   Socioeconomic History  . Marital status: Single    Spouse name: Not on file  . Number of children: 2  . Years of education: Not on file  . Highest education level: Not  on file  Occupational History  . Not on file  Social Needs  . Financial resource strain: Not on file  . Food insecurity    Worry: Not on file    Inability: Not on file  . Transportation needs    Medical: Not on file    Non-medical: Not on file  Tobacco Use  . Smoking status: Former Smoker    Types: Cigarettes    Quit date: 10/05/2016    Years since quitting: 2.2  . Smokeless tobacco: Never Used  Substance and Sexual Activity  . Alcohol use: No  . Drug use: No  . Sexual activity: Yes    Birth control/protection: Condom  Lifestyle  . Physical activity    Days per week: 0 days    Minutes per session: 0 min  . Stress: Not at all  Relationships  . Social connections    Talks on phone: More than three times a week    Gets together: Three times a week    Attends religious service: More than 4 times per year    Active member of club or organization: No    Attends meetings of clubs or organizations: Never    Relationship status: Never married  . Intimate partner violence    Fear of current or ex partner: No  Emotionally abused: No    Physically abused: No    Forced sexual activity: No  Other Topics Concern  . Not on file  Social History Narrative  . Not on file    Outpatient Medications Prior to Visit  Medication Sig Dispense Refill  . Multiple Vitamin (MULTIVITAMIN) tablet Take 1 tablet by mouth daily.    . Norethindrone-Ethinyl Estradiol-Fe Biphas (LO LOESTRIN FE) 1 MG-10 MCG / 10 MCG tablet Take 1 tablet by mouth daily. (Patient not taking: Reported on 12/19/2018) 84 tablet 0  . terconazole (TERAZOL 7) 0.4 % vaginal cream Place 1 applicator vaginally at bedtime. 45 g 0   No facility-administered medications prior to visit.       ROS:  Review of Systems  Constitutional: Negative for fatigue, fever and unexpected weight change.  Respiratory: Negative for cough, shortness of breath and wheezing.   Cardiovascular: Negative for chest pain, palpitations and leg  swelling.  Gastrointestinal: Negative for blood in stool, constipation, diarrhea, nausea and vomiting.  Endocrine: Negative for cold intolerance, heat intolerance and polyuria.  Genitourinary: Negative for dyspareunia, dysuria, flank pain, frequency, genital sores, hematuria, menstrual problem, pelvic pain, urgency, vaginal bleeding, vaginal discharge and vaginal pain.  Musculoskeletal: Negative for back pain, joint swelling and myalgias.  Skin: Negative for rash.  Neurological: Negative for dizziness, syncope, light-headedness, numbness and headaches.  Hematological: Negative for adenopathy.  Psychiatric/Behavioral: Negative for agitation, confusion, sleep disturbance and suicidal ideas. The patient is not nervous/anxious.   BREAST: No symptoms   Objective: BP 118/78   Ht 5\' 4"  (1.626 m)   Wt 113 lb 9.6 oz (51.5 kg)   LMP 11/20/2018 (Exact Date)   Breastfeeding No   BMI 19.50 kg/m    Physical Exam Constitutional:      Appearance: She is well-developed.  Genitourinary:     Vulva, vagina, cervix, uterus, right adnexa and left adnexa normal.     No vulval lesion or tenderness noted.     No vaginal discharge, erythema or tenderness.     No cervical polyp.     Uterus is not enlarged or tender.     No right or left adnexal mass present.     Right adnexa not tender.     Left adnexa not tender.  Neck:     Musculoskeletal: Normal range of motion.     Thyroid: No thyromegaly.  Cardiovascular:     Rate and Rhythm: Normal rate and regular rhythm.     Heart sounds: Normal heart sounds. No murmur.  Pulmonary:     Effort: Pulmonary effort is normal.     Breath sounds: Normal breath sounds.  Chest:     Breasts:        Right: No mass, nipple discharge, skin change or tenderness.        Left: No mass, nipple discharge, skin change or tenderness.  Abdominal:     Palpations: Abdomen is soft.     Tenderness: There is no abdominal tenderness. There is no guarding.  Musculoskeletal:  Normal range of motion.  Neurological:     General: No focal deficit present.     Mental Status: She is alert and oriented to person, place, and time.     Cranial Nerves: No cranial nerve deficit.  Skin:    General: Skin is warm and dry.  Psychiatric:        Mood and Affect: Mood normal.        Behavior: Behavior normal.  Thought Content: Thought content normal.        Judgment: Judgment normal.  Vitals signs reviewed.     Assessment/Plan: Encounter for annual routine gynecological examination -   Encounter for surveillance of contraceptive pills - Plan: Norethindrone Acetate-Ethinyl Estrad-FE (MICROGESTIN 24 FE) 1-20 MG-MCG(24) tablet, OCP start with next menses. Condoms for 1 mo. F/u prn.  Family history of breast cancer - Plan: MyRisk testing discussed. Pt to f/u if desires.  Family history of ovarian cancer   Meds ordered this encounter  Medications  . Norethindrone Acetate-Ethinyl Estrad-FE (MICROGESTIN 24 FE) 1-20 MG-MCG(24) tablet    Sig: Take 1 tablet by mouth daily.    Dispense:  84 tablet    Refill:  3    Order Specific Question:   Supervising Provider    Answer:   Nadara MustardHARRIS, ROBERT P [[id number]]             GYN counsel adequate intake of calcium and vitamin D, diet and exercise     F/U  Return in about 1 year (around 12/19/2019).  Alicia B. Copland, PA-C 12/19/2018 9:46 AM

## 2018-12-19 ENCOUNTER — Encounter: Payer: Self-pay | Admitting: Obstetrics and Gynecology

## 2018-12-19 ENCOUNTER — Other Ambulatory Visit: Payer: Self-pay

## 2018-12-19 ENCOUNTER — Ambulatory Visit (INDEPENDENT_AMBULATORY_CARE_PROVIDER_SITE_OTHER): Payer: BC Managed Care – PPO | Admitting: Obstetrics and Gynecology

## 2018-12-19 VITALS — BP 118/78 | Ht 64.0 in | Wt 113.6 lb

## 2018-12-19 DIAGNOSIS — Z3041 Encounter for surveillance of contraceptive pills: Secondary | ICD-10-CM

## 2018-12-19 DIAGNOSIS — Z01419 Encounter for gynecological examination (general) (routine) without abnormal findings: Secondary | ICD-10-CM | POA: Diagnosis not present

## 2018-12-19 DIAGNOSIS — Z8041 Family history of malignant neoplasm of ovary: Secondary | ICD-10-CM

## 2018-12-19 DIAGNOSIS — Z803 Family history of malignant neoplasm of breast: Secondary | ICD-10-CM | POA: Insufficient documentation

## 2018-12-19 MED ORDER — MICROGESTIN 24 FE 1-20 MG-MCG PO TABS: 1.0000 | ORAL_TABLET | Freq: Every day | ORAL | 3 refills | Status: AC

## 2018-12-19 NOTE — Patient Instructions (Signed)
I value your feedback and entrusting us with your care. If you get a [facility] patient survey, I would appreciate you taking the time to let us know about your experience today. Thank you! 

## 2019-05-13 NOTE — Progress Notes (Signed)
Patient, No Pcp Per   Chief Complaint  Patient presents with  . Vaginal Discharge    little irritation, no odor or itchiness x 1 week    HPI:      Cindy Lee is a 28 y.o. [id number] who LMP was Patient's last menstrual period was 04/21/2019 (exact date)., presents today for increased d/c with irritation, no odor for past wk. No meds to treat. Taking probiotics now. Stopped OCPs for about 1 1/2 wks and then sx started. No new soaps, detergents, no recent abx use. Hx of BV in distant past, treated for yeast vag 1/20. No urin sx, LBP, pelvic pain, fevers. She is sex active, no new partners.    Patient Active Problem List   Diagnosis Date Noted  . Family history of breast cancer 12/19/2018  . Family history of ovarian cancer 12/19/2018  . Encounter for surveillance of injectable contraceptive 08/10/2017  . History of pre-eclampsia 04/05/2017  . Vaginal birth after cesarean section 04/05/2017  . Previous cesarean section   . History of cesarean delivery 03/07/2017  . History of gestational hypertension 10/15/2016  . Postpartum care following vaginal delivery 09/15/2015    Past Surgical History:  Procedure Laterality Date  . CESAREAN SECTION N/A 09/12/2015   Procedure: CESAREAN SECTION;  Surgeon: Conard NovakStephen D Jackson, MD;  Location: [facility] ORS;  Service: Obstetrics;  Laterality: N/A;    Family History  Problem Relation Age of Onset  . Other Mother [id number]       Benign brain tumor  . Ovarian cancer Maternal Grandmother 3263  . Liver cancer Maternal Grandmother   . Breast cancer Paternal Grandmother 3743  . Colon cancer Paternal Grandmother 5550    Social History   Socioeconomic History  . Marital status: Single    Spouse name: Not on file  . Number of children: 2  . Years of education: Not on file  . Highest education level: Not on file  Occupational History  . Not on file  Tobacco Use  . Smoking status: Former Smoker    Types: Cigarettes    Quit date: 10/05/2016    Years  since quitting: 2.6  . Smokeless tobacco: Never Used  Substance and Sexual Activity  . Alcohol use: No  . Drug use: No  . Sexual activity: Yes    Birth control/protection: Pill  Other Topics Concern  . Not on file  Social History Narrative  . Not on file   Social Determinants of Health   Financial Resource Strain:   . Difficulty of Paying Living Expenses: Not on file  Food Insecurity:   . Worried About Programme researcher, broadcasting/film/videounning Out of Food in the Last Year: Not on file  . Ran Out of Food in the Last Year: Not on file  Transportation Needs:   . Lack of Transportation (Medical): Not on file  . Lack of Transportation (Non-Medical): Not on file  Physical Activity:   . Days of Exercise per Week: Not on file  . Minutes of Exercise per Session: Not on file  Stress:   . Feeling of Stress : Not on file  Social Connections:   . Frequency of Communication with Friends and Family: Not on file  . Frequency of Social Gatherings with Friends and Family: Not on file  . Attends Religious Services: Not on file  . Active Member of Clubs or Organizations: Not on file  . Attends BankerClub or Organization Meetings: Not on file  . Marital Status: Not on file  Intimate  Partner Violence:   . Fear of Current or Ex-Partner: Not on file  . Emotionally Abused: Not on file  . Physically Abused: Not on file  . Sexually Abused: Not on file    Outpatient Medications Prior to Visit  Medication Sig Dispense Refill  . Multiple Vitamin (MULTIVITAMIN) tablet Take 1 tablet by mouth daily.    . Norethindrone Acetate-Ethinyl Estrad-FE (MICROGESTIN 24 FE) 1-20 MG-MCG(24) tablet Take 1 tablet by mouth daily. 84 tablet 3   No facility-administered medications prior to visit.      ROS:  Review of Systems  Constitutional: Negative for fever.  Gastrointestinal: Negative for blood in stool, constipation, diarrhea, nausea and vomiting.  Genitourinary: Positive for vaginal discharge. Negative for dyspareunia, dysuria, flank pain,  frequency, hematuria, urgency, vaginal bleeding and vaginal pain.  Musculoskeletal: Negative for back pain.  Skin: Negative for rash.   BREAST: No symptoms   OBJECTIVE:   Vitals:  BP 100/80   Ht 5\' 4"  (1.626 m)   Wt 123 lb (55.8 kg)   LMP 04/21/2019 (Exact Date)   BMI 21.11 kg/m   Physical Exam Vitals reviewed.  Constitutional:      Appearance: She is well-developed.  Pulmonary:     Effort: Pulmonary effort is normal.  Genitourinary:    General: Normal vulva.     Pubic Area: No rash.      Labia:        Right: No rash, tenderness or lesion.        Left: No rash, tenderness or lesion.      Vagina: Vaginal discharge present. No erythema or tenderness.     Cervix: Normal.     Uterus: Normal. Not enlarged and not tender.      Adnexa: Right adnexa normal and left adnexa normal.       Right: No mass or tenderness.         Left: No mass or tenderness.    Musculoskeletal:        General: Normal range of motion.     Cervical back: Normal range of motion.  Skin:    General: Skin is warm and dry.  Neurological:     General: No focal deficit present.     Mental Status: She is alert and oriented to person, place, and time.  Psychiatric:        Mood and Affect: Mood normal.        Behavior: Behavior normal.        Thought Content: Thought content normal.        Judgment: Judgment normal.     Results: Results for orders placed or performed in visit on 05/14/19 (from the past 24 hour(s))  POCT Wet Prep with KOH     Status: Abnormal   Collection Time: 05/14/19 11:53 AM  Result Value Ref Range   Trichomonas, UA Negative    Clue Cells Wet Prep HPF POC pos    Epithelial Wet Prep HPF POC     Yeast Wet Prep HPF POC neg    Bacteria Wet Prep HPF POC     RBC Wet Prep HPF POC     WBC Wet Prep HPF POC     KOH Prep POC Positive (A) Negative     Assessment/Plan: Bacterial vaginosis - Plan: POCT Wet Prep with KOH, metroNIDAZOLE (FLAGYL) 500 MG tablet; Pos sx/wet prep. Rx flagyl,  No EtOH. F/u prn. Will RF if sx recur. Cont probiotics.    Meds ordered this encounter  Medications  .  metroNIDAZOLE (FLAGYL) 500 MG tablet    Sig: Take 1 tablet (500 mg total) by mouth 2 (two) times daily for 7 days.    Dispense:  14 tablet    Refill:  0    Order Specific Question:   Supervising Provider    Answer:   Gae Dry [[id number]]      Return if symptoms worsen or fail to improve.  Lalaine Overstreet B. Ajee Heasley, PA-C 05/14/2019 11:54 AM

## 2019-05-14 ENCOUNTER — Other Ambulatory Visit: Payer: Self-pay

## 2019-05-14 ENCOUNTER — Encounter: Payer: Self-pay | Admitting: Obstetrics and Gynecology

## 2019-05-14 ENCOUNTER — Ambulatory Visit (INDEPENDENT_AMBULATORY_CARE_PROVIDER_SITE_OTHER): Payer: BC Managed Care – PPO | Admitting: Obstetrics and Gynecology

## 2019-05-14 VITALS — BP 100/80 | Ht 64.0 in | Wt 123.0 lb

## 2019-05-14 DIAGNOSIS — B9689 Other specified bacterial agents as the cause of diseases classified elsewhere: Secondary | ICD-10-CM

## 2019-05-14 DIAGNOSIS — N760 Acute vaginitis: Secondary | ICD-10-CM

## 2019-05-14 LAB — POCT WET PREP WITH KOH
Clue Cells Wet Prep HPF POC: POSITIVE
KOH Prep POC: POSITIVE — AB
Trichomonas, UA: NEGATIVE
Yeast Wet Prep HPF POC: NEGATIVE

## 2019-05-14 MED ORDER — METRONIDAZOLE 500 MG PO TABS: 500.0000 mg | ORAL_TABLET | Freq: Two times a day (BID) | ORAL | 0 refills | Status: DC

## 2019-05-14 NOTE — Patient Instructions (Signed)
I value your feedback and entrusting us with your care. If you get a [facility] patient survey, I would appreciate you taking the time to let us know about your experience today. Thank you!  As of May 10, 2019, your lab results will be released to your MyChart immediately, before I even have a chance to see them. Please give me time to review them and contact you if there are any abnormalities. Thank you for your patience.  

## 2019-11-27 ENCOUNTER — Other Ambulatory Visit: Payer: Self-pay | Admitting: Obstetrics and Gynecology

## 2019-11-27 ENCOUNTER — Telehealth: Payer: Self-pay

## 2019-11-27 DIAGNOSIS — B9689 Other specified bacterial agents as the cause of diseases classified elsewhere: Secondary | ICD-10-CM

## 2019-11-27 MED ORDER — METRONIDAZOLE 500 MG PO TABS: 500.0000 mg | ORAL_TABLET | Freq: Two times a day (BID) | ORAL | 0 refills | Status: AC

## 2019-11-27 NOTE — Telephone Encounter (Signed)
Pt called triage needing a refill on her BV medication, sent to [facility] on [address], she states the front office said we could not fit her in until August 2nd.

## 2019-11-27 NOTE — Progress Notes (Signed)
Rx RF flagyl for BV 

## 2019-11-27 NOTE — Telephone Encounter (Signed)
Pt aware. Updated phone number.

## 2019-11-27 NOTE — Telephone Encounter (Signed)
Rx RF eRxd. F/u prn

## 2020-01-01 ENCOUNTER — Ambulatory Visit: Payer: BC Managed Care – PPO | Admitting: Obstetrics and Gynecology

## 2020-01-01 NOTE — Progress Notes (Deleted)
PCP:  Patient, No Pcp Per   No chief complaint on file.    HPI:      [name] is a 29 y.o. [id number] who LMP was No LMP recorded., presents today for her annual examination.  Her menses are regular every 28-30 days, lasting 3-7 days.  Dysmenorrhea mild, occurring first 1-2 days of flow. She does not have intermenstrual bleeding.  Sex activity: single partner, contraception - OCPs. Last Pap: 10/28/17 Results were: no abnormalities  Hx of STDs: none  There is a FH of breast cancer in her PGM, genetic testing not done. There is a FH of ovarian cancer in her MGM, genetic testing not done. Pt declined genetic testing last yr. The patient does not do self-breast exams.  Tobacco use: 1 cig daily, trying to quit. Alcohol use: none No drug use.  Exercise: moderately active  She does get adequate calcium and Vitamin D in her diet.   Past Medical History:  Diagnosis Date  . Family history of breast cancer   . Family history of ovarian cancer   . Preeclampsia    with G1 and G2  . Previous cesarean section   . Vaginal birth after cesarean section 04/05/2017    Past Surgical History:  Procedure Laterality Date  . CESAREAN SECTION N/A 09/12/2015   Procedure: CESAREAN SECTION;  Surgeon: Conard Novak, MD;  Location: [facility] ORS;  Service: Obstetrics;  Laterality: N/A;    Family History  Problem Relation Age of Onset  . Other Mother 32       Benign brain tumor  . Ovarian cancer Maternal Grandmother 96  . Liver cancer Maternal Grandmother   . Breast cancer Paternal Grandmother 21  . Colon cancer Paternal Grandmother 69    Social History   Socioeconomic History  . Marital status: Single    Spouse name: Not on file  . Number of children: 2  . Years of education: Not on file  . Highest education level: Not on file  Occupational History  . Not on file  Tobacco Use  . Smoking status: Former Smoker    Types: Cigarettes    Quit date: 10/05/2016    Years since  quitting: 3.2  . Smokeless tobacco: Never Used  Vaping Use  . Vaping Use: Never used  Substance and Sexual Activity  . Alcohol use: No  . Drug use: No  . Sexual activity: Yes    Birth control/protection: Pill  Other Topics Concern  . Not on file  Social History Narrative  . Not on file   Social Determinants of Health   Financial Resource Strain:   . Difficulty of Paying Living Expenses:   Food Insecurity:   . Worried About Programme researcher, broadcasting/film/video in the Last Year:   . Barista in the Last Year:   Transportation Needs:   . Freight forwarder (Medical):   Marland Kitchen Lack of Transportation (Non-Medical):   Physical Activity:   . Days of Exercise per Week:   . Minutes of Exercise per Session:   Stress:   . Feeling of Stress :   Social Connections:   . Frequency of Communication with Friends and Family:   . Frequency of Social Gatherings with Friends and Family:   . Attends Religious Services:   . Active Member of Clubs or Organizations:   . Attends Banker Meetings:   Marland Kitchen Marital Status:   Intimate Partner Violence:   . Fear of Current or  Ex-Partner:   . Emotionally Abused:   Marland Kitchen Physically Abused:   . Sexually Abused:     Outpatient Medications Prior to Visit  Medication Sig Dispense Refill  . Multiple Vitamin (MULTIVITAMIN) tablet Take 1 tablet by mouth daily.    . Norethindrone Acetate-Ethinyl Estrad-FE (MICROGESTIN 24 FE) 1-20 MG-MCG(24) tablet Take 1 tablet by mouth daily. 84 tablet 3   No facility-administered medications prior to visit.      ROS:  Review of Systems  Constitutional: Negative for fatigue, fever and unexpected weight change.  Respiratory: Negative for cough, shortness of breath and wheezing.   Cardiovascular: Negative for chest pain, palpitations and leg swelling.  Gastrointestinal: Negative for blood in stool, constipation, diarrhea, nausea and vomiting.  Endocrine: Negative for cold intolerance, heat intolerance and polyuria.    Genitourinary: Negative for dyspareunia, dysuria, flank pain, frequency, genital sores, hematuria, menstrual problem, pelvic pain, urgency, vaginal bleeding, vaginal discharge and vaginal pain.  Musculoskeletal: Negative for back pain, joint swelling and myalgias.  Skin: Negative for rash.  Neurological: Negative for dizziness, syncope, light-headedness, numbness and headaches.  Hematological: Negative for adenopathy.  Psychiatric/Behavioral: Negative for agitation, confusion, sleep disturbance and suicidal ideas. The patient is not nervous/anxious.   BREAST: No symptoms   Objective: There were no vitals taken for this visit.   Physical Exam Constitutional:      Appearance: She is well-developed.  Genitourinary:     Vulva, vagina, cervix, uterus, right adnexa and left adnexa normal.     No vulval lesion or tenderness noted.     No vaginal discharge, erythema or tenderness.     No cervical polyp.     Uterus is not enlarged or tender.     No right or left adnexal mass present.     Right adnexa not tender.     Left adnexa not tender.  Neck:     Thyroid: No thyromegaly.  Cardiovascular:     Rate and Rhythm: Normal rate and regular rhythm.     Heart sounds: Normal heart sounds. No murmur heard.   Pulmonary:     Effort: Pulmonary effort is normal.     Breath sounds: Normal breath sounds.  Chest:     Breasts:        Right: No mass, nipple discharge, skin change or tenderness.        Left: No mass, nipple discharge, skin change or tenderness.  Abdominal:     Palpations: Abdomen is soft.     Tenderness: There is no abdominal tenderness. There is no guarding.  Musculoskeletal:        General: Normal range of motion.     Cervical back: Normal range of motion.  Neurological:     General: No focal deficit present.     Mental Status: She is alert and oriented to person, place, and time.     Cranial Nerves: No cranial nerve deficit.  Skin:    General: Skin is warm and dry.   Psychiatric:        Mood and Affect: Mood normal.        Behavior: Behavior normal.        Thought Content: Thought content normal.        Judgment: Judgment normal.  Vitals reviewed.     Assessment/Plan: Encounter for annual routine gynecological examination -   Encounter for surveillance of contraceptive pills - Plan: Norethindrone Acetate-Ethinyl Estrad-FE (MICROGESTIN 24 FE) 1-20 MG-MCG(24) tablet, OCP start with next menses. Condoms for 1 mo. F/u prn.  Family history of breast cancer - Plan: MyRisk testing discussed. Pt to f/u if desires.  Family history of ovarian cancer   No orders of the defined types were placed in this encounter.            GYN counsel adequate intake of calcium and vitamin D, diet and exercise     F/U  No follow-ups on file.  Courtany Mcmurphy B. Gill Delrossi, PA-C 01/01/2020 8:53 AM

## 2020-02-05 ENCOUNTER — Ambulatory Visit: Payer: BLUE CROSS/BLUE SHIELD | Admitting: Obstetrics and Gynecology

## 2020-02-05 NOTE — Progress Notes (Deleted)
PCP:  Patient, No Pcp Per   No chief complaint on file.    HPI:      Cindy Lee is a 29 y.o. [id number] who LMP was No LMP recorded., presents today for her annual examination.  Her menses are regular every 28-30 days, lasting 3-7 days.  Dysmenorrhea mild, occurring first 1-2 days of flow. She does not have intermenstrual bleeding.  Sex activity: single partner, contraception - condoms. Would like OCPs. Got Lo Loestrin RF 1/20 but too expensive, so not taking them. Last Pap: 10/28/17 Results were: no abnormalities  Hx of STDs: none Hx of BV.   There is a FH of breast cancer in her PGM, genetic testing not done. There is a FH of ovarian cancer in her MGM, genetic testing not done. The patient does not do self-breast exams.  Tobacco use: 1 cig daily, trying to quit. Alcohol use: none No drug use.  Exercise: moderately active  She does get adequate calcium and Vitamin D in her diet.   Past Medical History:  Diagnosis Date  . Family history of breast cancer   . Family history of ovarian cancer   . Preeclampsia    with G1 and G2  . Previous cesarean section   . Vaginal birth after cesarean section 04/05/2017    Past Surgical History:  Procedure Laterality Date  . CESAREAN SECTION N/A 09/12/2015   Procedure: CESAREAN SECTION;  Surgeon: Conard Novak, MD;  Location: [facility] OR[facility];  Service: Obstetrics;  Laterality: N/A;    Family History  Problem Relation Age of Onset  . Other Mother 27       Benign brain tumor  . Ovarian cancer Maternal Grandmother 81  . Liver cancer Maternal Grandmother   . Breast cancer Paternal Grandmother 58  . Colon cancer Paternal Grandmother 102    Social History   Socioeconomic History  . Marital status: Single    Spouse name: Not on file  . Number of children: 2  . Years of education: Not on file  . Highest education level: Not on file  Occupational History  . Not on file  Tobacco Use  . Smoking status: Former Smoker     Types: Cigarettes    Quit date: 10/05/2016    Years since quitting: 3.3  . Smokeless tobacco: Never Used  Vaping Use  . Vaping Use: Never used  Substance and Sexual Activity  . Alcohol use: No  . Drug use: No  . Sexual activity: Yes    Birth control/protection: Pill  Other Topics Concern  . Not on file  Social History Narrative  . Not on file   Social Determinants of Health   Financial Resource Strain:   . Difficulty of Paying Living Expenses: Not on file  Food Insecurity:   . Worried About Programme researcher, broadcasting/film/video in the Last Year: Not on file  . Ran Out of Food in the Last Year: Not on file  Transportation Needs:   . Lack of Transportation (Medical): Not on file  . Lack of Transportation (Non-Medical): Not on file  Physical Activity:   . Days of Exercise per Week: Not on file  . Minutes of Exercise per Session: Not on file  Stress:   . Feeling of Stress : Not on file  Social Connections:   . Frequency of Communication with Friends and Family: Not on file  . Frequency of Social Gatherings with Friends and Family: Not on file  . Attends Religious Services: Not  on file  . Active Member of Clubs or Organizations: Not on file  . Attends Banker Meetings: Not on file  . Marital Status: Not on file  Intimate Partner Violence:   . Fear of Current or Ex-Partner: Not on file  . Emotionally Abused: Not on file  . Physically Abused: Not on file  . Sexually Abused: Not on file    Outpatient Medications Prior to Visit  Medication Sig Dispense Refill  . Multiple Vitamin (MULTIVITAMIN) tablet Take 1 tablet by mouth daily.    . Norethindrone Acetate-Ethinyl Estrad-FE (MICROGESTIN 24 FE) 1-20 MG-MCG(24) tablet Take 1 tablet by mouth daily. 84 tablet 3   No facility-administered medications prior to visit.      ROS:  Review of Systems  Constitutional: Negative for fatigue, fever and unexpected weight change.  Respiratory: Negative for cough, shortness of breath and  wheezing.   Cardiovascular: Negative for chest pain, palpitations and leg swelling.  Gastrointestinal: Negative for blood in stool, constipation, diarrhea, nausea and vomiting.  Endocrine: Negative for cold intolerance, heat intolerance and polyuria.  Genitourinary: Negative for dyspareunia, dysuria, flank pain, frequency, genital sores, hematuria, menstrual problem, pelvic pain, urgency, vaginal bleeding, vaginal discharge and vaginal pain.  Musculoskeletal: Negative for back pain, joint swelling and myalgias.  Skin: Negative for rash.  Neurological: Negative for dizziness, syncope, light-headedness, numbness and headaches.  Hematological: Negative for adenopathy.  Psychiatric/Behavioral: Negative for agitation, confusion, sleep disturbance and suicidal ideas. The patient is not nervous/anxious.   BREAST: No symptoms   Objective: There were no vitals taken for this visit.   Physical Exam Constitutional:      Appearance: She is well-developed.  Genitourinary:     Vulva, vagina, cervix, uterus, right adnexa and left adnexa normal.     No vulval lesion or tenderness noted.     No vaginal discharge, erythema or tenderness.     No cervical polyp.     Uterus is not enlarged or tender.     No right or left adnexal mass present.     Right adnexa not tender.     Left adnexa not tender.  Neck:     Thyroid: No thyromegaly.  Cardiovascular:     Rate and Rhythm: Normal rate and regular rhythm.     Heart sounds: Normal heart sounds. No murmur heard.   Pulmonary:     Effort: Pulmonary effort is normal.     Breath sounds: Normal breath sounds.  Chest:     Breasts:        Right: No mass, nipple discharge, skin change or tenderness.        Left: No mass, nipple discharge, skin change or tenderness.  Abdominal:     Palpations: Abdomen is soft.     Tenderness: There is no abdominal tenderness. There is no guarding.  Musculoskeletal:        General: Normal range of motion.     Cervical  back: Normal range of motion.  Neurological:     General: No focal deficit present.     Mental Status: She is alert and oriented to person, place, and time.     Cranial Nerves: No cranial nerve deficit.  Skin:    General: Skin is warm and dry.  Psychiatric:        Mood and Affect: Mood normal.        Behavior: Behavior normal.        Thought Content: Thought content normal.  Judgment: Judgment normal.  Vitals reviewed.     Assessment/Plan: Encounter for annual routine gynecological examination -   Encounter for surveillance of contraceptive pills - Plan: Norethindrone Acetate-Ethinyl Estrad-FE (MICROGESTIN 24 FE) 1-20 MG-MCG(24) tablet, OCP start with next menses. Condoms for 1 mo. F/u prn.  Family history of breast cancer - Plan: MyRisk testing discussed. Pt to f/u if desires.  Family history of ovarian cancer   No orders of the defined types were placed in this encounter.            GYN counsel adequate intake of calcium and vitamin D, diet and exercise     F/U  No follow-ups on file.  Ilay Capshaw B. Marche Hottenstein, PA-C 02/05/2020 10:03 AM

## 2020-03-29 ENCOUNTER — Emergency Department
Admission: EM | Admit: 2020-03-29 | Discharge: 2020-03-30 | Disposition: A | Discharge status: Home / Self Care | Payer: BLUE CROSS/BLUE SHIELD | Attending: Emergency Medicine | Admitting: Emergency Medicine

## 2020-03-29 ENCOUNTER — Emergency Department: Payer: BLUE CROSS/BLUE SHIELD

## 2020-03-29 ENCOUNTER — Encounter: Payer: Self-pay | Admitting: Emergency Medicine

## 2020-03-29 ENCOUNTER — Other Ambulatory Visit: Payer: Self-pay

## 2020-03-29 DIAGNOSIS — Y92410 Unspecified street and highway as the place of occurrence of the external cause: Secondary | ICD-10-CM | POA: Diagnosis not present

## 2020-03-29 DIAGNOSIS — R0789 Other chest pain: Secondary | ICD-10-CM | POA: Diagnosis not present

## 2020-03-29 DIAGNOSIS — R110 Nausea: Secondary | ICD-10-CM | POA: Insufficient documentation

## 2020-03-29 DIAGNOSIS — R079 Chest pain, unspecified: Secondary | ICD-10-CM

## 2020-03-29 DIAGNOSIS — Z87891 Personal history of nicotine dependence: Secondary | ICD-10-CM | POA: Insufficient documentation

## 2020-03-29 DIAGNOSIS — M542 Cervicalgia: Secondary | ICD-10-CM | POA: Diagnosis not present

## 2020-03-29 DIAGNOSIS — R519 Headache, unspecified: Secondary | ICD-10-CM | POA: Insufficient documentation

## 2020-03-29 DIAGNOSIS — R000 Tachycardia, unspecified: Secondary | ICD-10-CM | POA: Diagnosis not present

## 2020-03-29 LAB — COMPREHENSIVE METABOLIC PANEL
ALT: 35 U/L (ref 0–44)
AST: 26 U/L (ref 15–41)
Albumin: 4.8 g/dL (ref 3.5–5.0)
Alkaline Phosphatase: 59 U/L (ref 38–126)
Anion gap: 12 (ref 5–15)
BUN: 11 mg/dL (ref 6–20)
CO2: 22 mmol/L (ref 22–32)
Calcium: 9.2 mg/dL (ref 8.9–10.3)
Chloride: 106 mmol/L (ref 98–111)
Creatinine, Ser: 0.74 mg/dL (ref 0.44–1.00)
GFR, Estimated: >60 mL/min (ref >60)
Glucose, Bld: 98 mg/dL (ref 70–99)
Potassium: 3.4 mmol/L — ABNORMAL LOW (ref 3.5–5.1)
Sodium: 140 mmol/L (ref 135–145)
Total Bilirubin: 1.1 mg/dL (ref 0.3–1.2)
Total Protein: 8.1 g/dL (ref 6.5–8.1)

## 2020-03-29 LAB — CBC WITH DIFFERENTIAL/PLATELET
Abs Immature Granulocytes: 0.02 10*3/uL (ref 0.00–0.07)
Basophils Absolute: 0 10*3/uL (ref 0.0–0.1)
Basophils Relative: 0 %
Eosinophils Absolute: 0 10*3/uL (ref 0.0–0.5)
Eosinophils Relative: 0 %
HCT: 35.9 % — ABNORMAL LOW (ref 36.0–46.0)
Hemoglobin: 12.3 g/dL (ref 12.0–15.0)
Immature Granulocytes: 0 %
Lymphocytes Relative: 16 %
Lymphs Abs: 1.2 10*3/uL (ref 0.7–4.0)
MCH: 31.3 pg (ref 26.0–34.0)
MCHC: 34.3 g/dL (ref 30.0–36.0)
MCV: 91.3 fL (ref 80.0–100.0)
Monocytes Absolute: 0.8 10*3/uL (ref 0.1–1.0)
Monocytes Relative: 10 %
Neutro Abs: 5.5 10*3/uL (ref 1.7–7.7)
Neutrophils Relative %: 74 %
Platelets: 318 10*3/uL (ref 150–400)
RBC: 3.93 MIL/uL (ref 3.87–5.11)
RDW: 13.1 % (ref 11.5–15.5)
WBC: 7.6 10*3/uL (ref 4.0–10.5)
nRBC: 0 % (ref 0.0–0.2)

## 2020-03-29 MED ORDER — ONDANSETRON HCL 4 MG/2ML IJ SOLN
4.0000 mg | Freq: Once | INTRAMUSCULAR | Status: AC
  Administered 2020-03-29: 4 mg via INTRAVENOUS
  Filled 2020-03-29: qty 2

## 2020-03-29 MED ORDER — IOHEXOL 300 MG/ML  SOLN
100.0000 mL | Freq: Once | INTRAMUSCULAR | Status: AC | PRN
  Administered 2020-03-29: 100 mL via INTRAVENOUS
  Filled 2020-03-29: qty 100

## 2020-03-29 MED ORDER — FENTANYL CITRATE (PF) 100 MCG/2ML IJ SOLN
50.0000 ug | Freq: Once | INTRAMUSCULAR | Status: DC
  Filled 2020-03-29: qty 2
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
## 2020-03-29 NOTE — ED Provider Notes (Signed)
-----------------------------------------   11:00 PM on 03/29/2020 -----------------------------------------  ED ECG REPORT I, Loleta Rose, the attending physician, personally viewed and interpreted this ECG.  Date: 03/29/2020 EKG Time: 22: 51 Rate: 99 Rhythm: Borderline sinus tachycardia QRS Axis: normal Intervals: normal ST/T Wave abnormalities: normal Narrative Interpretation: no evidence of acute ischemia    Loleta Rose, MD 03/29/20 2300
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
## 2020-03-30 MED ORDER — METHOCARBAMOL 500 MG PO TABS: 500.0000 mg | ORAL_TABLET | Freq: Three times a day (TID) | ORAL | 0 refills | Status: AC | PRN

## 2020-03-30 MED ORDER — MELOXICAM 15 MG PO TABS: 15.0000 mg | ORAL_TABLET | Freq: Every day | ORAL | 1 refills | Status: AC

## 2020-03-30 NOTE — Discharge Instructions (Signed)
Take meloxicam and Robaxin for pain and inflammation. Please follow-up with primary care provider for a mammogram.

## 2020-03-31 ENCOUNTER — Telehealth: Payer: Self-pay

## 2020-03-31 ENCOUNTER — Other Ambulatory Visit: Payer: Self-pay | Admitting: Obstetrics and Gynecology

## 2020-03-31 DIAGNOSIS — N6310 Unspecified lump in the right breast, unspecified quadrant: Secondary | ICD-10-CM

## 2020-03-31 DIAGNOSIS — N6459 Other signs and symptoms in breast: Secondary | ICD-10-CM

## 2020-03-31 NOTE — Telephone Encounter (Signed)
Pls sched u/s only per Lelon Mast at [place]. Mammo order in system in case needed. Kellee to discuss appt info with pt.
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
## 2020-04-02 ENCOUNTER — Other Ambulatory Visit: Payer: Self-pay

## 2020-04-02 ENCOUNTER — Ambulatory Visit
Admission: RE | Admit: 2020-04-02 | Discharge: 2020-04-02 | Disposition: A | Discharge status: Home / Self Care | Payer: BLUE CROSS/BLUE SHIELD | Source: Ambulatory Visit | Attending: Obstetrics and Gynecology | Admitting: Obstetrics and Gynecology

## 2020-04-02 DIAGNOSIS — N6310 Unspecified lump in the right breast, unspecified quadrant: Secondary | ICD-10-CM | POA: Insufficient documentation

## 2020-04-02 DIAGNOSIS — N6459 Other signs and symptoms in breast: Secondary | ICD-10-CM | POA: Diagnosis not present
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
